# Patient Record
Sex: Female | Born: 1965 | Race: Black or African American | Hispanic: No | Marital: Married
Health system: Southern US, Community
[De-identification: ages and names within clinical notes are randomized; demographics above are authoritative.]

## PROBLEM LIST (undated history)

## (undated) DIAGNOSIS — N200 Calculus of kidney: Secondary | ICD-10-CM

## (undated) DIAGNOSIS — E119 Type 2 diabetes mellitus without complications: Secondary | ICD-10-CM

## (undated) DIAGNOSIS — I10 Essential (primary) hypertension: Secondary | ICD-10-CM

## (undated) DIAGNOSIS — E282 Polycystic ovarian syndrome: Secondary | ICD-10-CM

## (undated) HISTORY — PX: LAPAROSCOPIC, GASTRIC BYPASS, CONVERSION FROM SLEEVE GASTRECTOMY: SHX9539

## (undated) HISTORY — DX: Type 2 diabetes mellitus without complications: E11.9

## (undated) HISTORY — DX: Calculus of kidney: N20.0

## (undated) HISTORY — DX: Polycystic ovarian syndrome: E28.2

## (undated) HISTORY — DX: Essential (primary) hypertension: I10

## (undated) HISTORY — PX: GASTRECTOMY: SHX58

## (undated) HISTORY — PX: BACK SURGERY: SHX140

## (undated) HISTORY — PX: ABDOMINAL HYSTERECTOMY: SHX81

---

## 1989-09-17 HISTORY — PX: REDUCTION MAMMAPLASTY: SUR839

## 2001-12-21 ENCOUNTER — Emergency Department: Admit: 2001-12-21 | Payer: Self-pay | Source: Emergency Department | Admitting: Emergency Medicine

## 2006-04-22 ENCOUNTER — Emergency Department: Admit: 2006-04-22 | Payer: Self-pay | Source: Emergency Department | Admitting: Emergency Medicine

## 2006-04-22 LAB — CBC WITH AUTO DIFFERENTIAL CERNER
Basophils Absolute: 0 /mm3 (ref 0.0–0.2)
Basophils: 1 % (ref 0–2)
Eosinophils Absolute: 0.1 /mm3 (ref 0.0–0.2)
Eosinophils: 2 % (ref 0–5)
Granulocytes Absolute: 2.6 /mm3 (ref 1.8–8.1)
Hematocrit: 42.1 % (ref 37.0–47.0)
Hgb: 13.8 G/DL (ref 12.0–16.0)
Lymphocytes Absolute: 2.8 /mm3 (ref 0.5–4.4)
Lymphocytes: 44 % — ABNORMAL HIGH (ref 15–41)
MCH: 29.7 PG (ref 28.0–32.0)
MCHC: 32.7 G/DL (ref 32.0–36.0)
MCV: 90.8 FL (ref 80.0–100.0)
MPV: 8.4 FL (ref 7.4–10.4)
Monocytes Absolute: 0.8 /mm3 (ref 0.0–1.2)
Monocytes: 13 % — ABNORMAL HIGH (ref 0–11)
Neutrophils %: 41 % — ABNORMAL LOW (ref 52–75)
Platelets: 281 /mm3 (ref 140–400)
RBC: 4.64 /mm3 (ref 4.20–5.40)
RDW: 13 % (ref 11.5–15.0)
WBC: 6.4 /mm3 (ref 3.5–10.8)

## 2006-04-22 LAB — COMPREHENSIVE METABOLIC PANEL - AH CERNER
ALT: 50 U/L — ABNORMAL HIGH (ref 0–31)
AST (SGOT): 41 U/L — ABNORMAL HIGH (ref 0–31)
Albumin/Globulin Ratio: 1.4 (ref 1.1–2.2)
Albumin: 4.3 g/dL (ref 3.4–4.8)
Alkaline Phosphatase: 104 U/L (ref 35–104)
Anion Gap: 11 mEq/L (ref 5–15)
BUN: 8 mg/dL (ref 6–20)
Bilirubin, Total: 0.5 mg/dL (ref 0.0–1.0)
CA: 4.1 mEq/L (ref 3.8–4.6)
CO2: 25.2 mEq/L (ref 22.0–29.0)
Calcium: 9.5 mg/dL (ref 8.4–10.2)
Chloride: 102 mEq/L (ref 96–108)
Creatinine: 0.6 mg/dL (ref 0.4–1.1)
Globulin: 3.1 g/dL (ref 2.0–3.6)
Glucose: 92 mg/dL
Osmolality Calculated: 284 mosm/kg (ref 282–298)
Potassium: 4 mEq/L (ref 3.3–5.1)
Protein, Total: 7.4 g/dL (ref 6.4–8.3)
Sodium: 138 mEq/L (ref 135–145)
UN/CREA SOFT: 13 RATIO (ref 6–33)

## 2006-04-22 LAB — URINALYSIS WITH MICROSCOPIC
Bilirubin, UA: NEGATIVE
Blood, UA: NEGATIVE
Glucose, UA: NEGATIVE
Ketones UA: NEGATIVE
Leukocyte Esterase, UA: NEGATIVE
Nitrite, UA: NEGATIVE
Protein, UR: NEGATIVE
Specific Gravity UA POCT: 1.023 (ref 1.005–1.030)
Squamous Epithelial Cells, Urine: 5 /LPF — ABNORMAL HIGH (ref 0–0)
Urine pH: 5 (ref 4.6–8.0)
Urobilinogen, UA: 0.2 EU/dL (ref 0.2–1.0)

## 2006-04-22 LAB — HEMOLYSIS INDEX: Hemolysis Index: 4 Units

## 2006-04-22 LAB — GFR

## 2006-04-22 LAB — AMYLASE: Amylase: 56 U/L (ref 20–148)

## 2006-04-22 LAB — LIPASE: Lipase: 19 U/L (ref 13–60)

## 2008-01-05 ENCOUNTER — Ambulatory Visit: Admission: RE | Admit: 2008-01-05 | Payer: Self-pay | Source: Ambulatory Visit | Admitting: Internal Medicine

## 2009-03-17 HISTORY — PX: HYSTERECTOMY: SHX81

## 2010-01-16 ENCOUNTER — Encounter (INDEPENDENT_AMBULATORY_CARE_PROVIDER_SITE_OTHER): Payer: Self-pay

## 2010-03-16 ENCOUNTER — Inpatient Hospital Stay
Admission: EM | Admit: 2010-03-16 | Disposition: A | Discharge status: Home / Self Care | Payer: Self-pay | Source: Emergency Department | Admitting: Internal Medicine

## 2010-03-16 LAB — CBC AND DIFFERENTIAL
Baso(Absolute): 0.04 10*3/uL (ref 0.00–0.20)
Basophils: 0 % (ref 0–2)
Eosinophils Absolute: 0.02 10*3/uL (ref 0.00–0.70)
Eosinophils: 0 % (ref 0–5)
Hematocrit: 40.5 % (ref 37.0–47.0)
Hgb: 13.6 g/dL (ref 12.0–16.0)
Immature Granulocytes Absolute: 0.04 10*3/uL
Immature Granulocytes: 0 % (ref 0–1)
Lymphocytes Absolute: 1.77 10*3/uL (ref 0.50–4.40)
Lymphocytes: 15 % (ref 15–41)
MCH: 29.8 pg (ref 28.0–32.0)
MCHC: 33.6 g/dL (ref 32.0–36.0)
MCV: 88.6 fL (ref 80.0–100.0)
MPV: 11.2 fL (ref 9.4–12.3)
Monocytes Absolute: 0.84 10*3/uL (ref 0.00–1.20)
Monocytes: 7 % (ref 0–11)
Neutrophils Absolute: 8.94 10*3/uL
Neutrophils: 77 % — ABNORMAL HIGH (ref 52–75)
Platelets: 258 10*3/uL (ref 140–400)
RBC: 4.57 10*6/uL (ref 4.20–5.40)
RDW: 13 % (ref 12–15)
WBC: 11.61 10*3/uL — ABNORMAL HIGH (ref 3.50–10.80)

## 2010-03-16 LAB — HEMOGLOBIN A1C: Hemoglobin A1C: 7.4 % — ABNORMAL HIGH (ref 0.0–6.0)

## 2010-03-16 LAB — URINALYSIS, REFLEX TO MICROSCOPIC EXAM IF INDICATED
Bilirubin, UA: NEGATIVE
Glucose, UA: 50 — AB
Ketones UA: 25 — AB
Nitrite, UA: POSITIVE — AB
Protein, UR: >300 — AB
RBC, UA: 104 /HPF (ref 0–5)
Specific Gravity UA POCT: 1.005 (ref 1.001–1.035)
Squamous Epithelial Cells, Urine: 1 /HPF (ref 0–25)
Urine pH: 7 (ref 5.0–8.0)
Urobilinogen, UA: NORMAL mg/dL
WBC, UA: 27 /HPF (ref 0–5)

## 2010-03-16 LAB — TSH: TSH: 1.63 u[IU]/mL (ref 0.35–4.94)

## 2010-03-16 LAB — BASIC METABOLIC PANEL
Anion Gap: 12 (ref 5.0–15.0)
BUN: 8 mg/dL (ref 7.0–19.0)
CO2: 22 mEq/L (ref 22–29)
Calcium: 9 mg/dL (ref 8.5–10.5)
Chloride: 103 mEq/L (ref 98–107)
Creatinine: 0.7 mg/dL (ref 0.6–1.0)
Glucose: 225 mg/dL — ABNORMAL HIGH (ref 70–100)
Potassium: 4 mEq/L (ref 3.5–5.1)
Sodium: 137 mEq/L (ref 136–145)

## 2010-03-16 LAB — HEMOLYSIS INDEX: Hemolysis Index: 14 Index (ref 0–18)

## 2010-03-16 LAB — GFR: EGFR: >60

## 2010-03-17 LAB — URINALYSIS, REFLEX TO MICROSCOPIC EXAM IF INDICATED
Bilirubin, UA: NEGATIVE
Glucose, UA: NEGATIVE
Ketones UA: NEGATIVE
Nitrite, UA: NEGATIVE
Protein, UR: 100 — AB
RBC, UA: 116 /HPF (ref 0–5)
Specific Gravity UA POCT: 1.01 (ref 1.001–1.035)
Squamous Epithelial Cells, Urine: 1 /HPF (ref 0–25)
Urine pH: 5 (ref 5.0–8.0)
Urobilinogen, UA: NORMAL mg/dL
WBC, UA: 110 /HPF (ref 0–5)

## 2010-03-17 LAB — HEMOLYSIS INDEX: Hemolysis Index: 2 Index (ref 0–18)

## 2010-03-17 LAB — LIPID PANEL
Cholesterol / HDL Ratio: 3.5 Index
Cholesterol: 211 mg/dL — ABNORMAL HIGH (ref 0–199)
HDL: 60 mg/dL (ref >40)
LDL Calculated: 134 mg/dL — ABNORMAL HIGH (ref 0–99)
Triglycerides: 84 mg/dL (ref 34–149)
VLDL Calculated: 17 mg/dL (ref 10–40)

## 2010-08-25 ENCOUNTER — Ambulatory Visit: Admission: RE | Admit: 2010-08-25 | Payer: Self-pay | Source: Ambulatory Visit | Admitting: Gastroenterology

## 2010-08-25 ENCOUNTER — Ambulatory Visit: Payer: Self-pay

## 2010-08-28 LAB — LAB USE ONLY - HISTORICAL SURGICAL PATHOLOGY

## 2011-06-04 LAB — ECG 12-LEAD
Atrial Rate: 91 {beats}/min
P Axis: 52 degrees
P-R Interval: 174 ms
Q-T Interval: 360 ms
QRS Duration: 76 ms
QTC Calculation (Bezet): 442 ms
R Axis: 0 degrees
T Axis: 30 degrees
Ventricular Rate: 91 {beats}/min

## 2011-07-06 NOTE — Consults (Signed)
PATIENTJACORA, [name]      MEDICAL RECORD NUMBER:         [id number]      PATIENT LOCATION/SERVICE:       [facility]            DATE OF CONSULTATION:          01/05/2008      CONSULTING PHYSICIAN:          Tally Due, MD      REFERRING PHYSICIAN:            REASON FOR CONSULTATION:  New patient referral for lumbar epidural steroid      injection.            HISTORY OF PRESENT ILLNESS:  The patient is a very pleasant 45 year old      woman who has been suffering from back problems since February of this      year.  It started after she shoveled snow.  After shoveling snow, she says      her back got very stiff.  Initially, she said it felt like the stiffness      was getting better but actually, the pain kept on increasing and since the      first week of March, it has gotten much worse.            Her pain is located across her low back and radiates to the lateral aspect of      her right hip and radiates further down along the posterior thigh, up to her      knee.  She denied any radiation distal to the knee.  She denied any pain in      her left side.            She reported associated tingling and numbness in her right leg and      toes.            She has taken anti-inflammatory medications for this.  Aleve did not      help much.  She took a course of oral steroids.  Oral steroids helped her      just a little bit with stiffness but did not help much with the pain.  She      has taken Vicodin.  She has also gone through physical therapy but it has      not helped much.            Severity of her pain is about 5 out of 10 right now.  The      worst pain lately could be about 7 out of 10.  Two weeks ago, it was even      worse at 9 to 10 out of 10.            The pain is alleviated to some extent by sitting.  It is worse by standing      and walking.  She denied any weakness in      her lower extremities.  She denies buckling of her knees.  She reported      occasionally that she needs  to drag her feet.            She denied urinary hesitancy      or incontinence.  A few days ago, she noted urinary  frequency and thought      that she was having a urinary tract infection.  She said today she was      feeling fine.  Pain affects her sleep and physical activity and she is      looking for some help.            MRI of the lumbosacral spine performed on      12/15/2007 has been reported as follows      1.   At T12-L1 and L1-L2, the intervertebral disks showed minimal      degeneration.      2.   L2-L3 level is unremarkable.      3.   At L3-L4, there is diffuse central, very minimal chronic disk bulge      with related spinal narrowing on contralateral basis.  There is evidence of      degenerative disk disease and degeneration of right facet joint.  Foramen      are patent.      4.   At L4-L5, there is diffuse central mild to moderate disk bulge with      small tear of the posterior annulus centrally without extrusion and mild to      moderate spinal stenosis.  The intervertebral disk showed mild      degeneration.  There is very minimal narrowing of the left foramen.            PAST MEDICAL HISTORY:  Past medical history is significant for      1.   Hypertension.      2.   Migraine headaches.      3.   Polycystic ovary disease.            PAST SURGICAL HISTORY:  Past surgical history is significant for breast      reduction.            REVIEW OF SYSTEMS:  Review of systems is as mentioned in the history of      present illness.  Her last menstrual period was 1-1/2 years ago.  This is      because of polycystic ovarian disorder.            MEDICATIONS:   Current medications are      1.   Metoprolol 50 mg p.o. daily.      2.   Aleve p.r.n.      3.   Vicodin p.r.n.      4.   Flexeril p.r.n.            She has not taken Aleve in the past two weeks.            PHYSICAL EXAMINATION:  On physical examination, the patient was awake,      alert and oriented x3.  There was no acute distress.  She was 5 feet,  7.5      inches tall and weighed 291 pounds.  Blood pressure was 160/103, heart rate      was 92 beats per minute, respirations were 20 per minute, temperature was      98.2 and oxygen saturation was 98% on room air.  Heart:  Regular.  Lungs:      Showed no acute respiratory distress.  She was obese.  Lower extremity      examination showed sensation to light touch and was intact along the left      lower extremity.  Sensation to light touch was reduced along  the medial      foot, dorsum, lateral and bottom of her right foot.  The strength was 5/5      bilaterally.  There was no clonus bilaterally.  There was midline and      paramedian tenderness along the lumbosacral spine bilaterally.  L4 patellar      and S1 ankle reflexes were absent bilaterally.  Plantar reflexes were      downgoing.            ASSESSMENT AND PLAN      1.   Chronic low back pain with right lower extremity radiation.      2.   Evidence of degenerative disk disease, disk bulge with tear of      posterior annulus.  Evidence of mild to moderate spinal stenosis.      3.   Evidence of lumbosacral radiculopathy.            It is appropriate to try lumbar epidural steroid injection.  I would choose      to do it at L4-L5 today.  The next option would be to do transforaminal      epidural steroid injection.  We discussed the following aspects with      respect to the procedure.  They included      1.   Relationship between magnetic resonance imaging findings and her      symptoms.      2.   Rationale for the procedure.      3.   Outcome data.      4.   Onset and duration of action.      5.   Description of the procedure.      6.   Expectations and plan of action after the first injection.      7.   Risks, benefits and alternative treatments.  The risks associated with      the procedure included but are not limited to bleeding, infection, may not      work, pain may get worse, nerve injury, paralysis, allergic reactions,      spinal headache and side  effects related to steroids.            Side effects related to steroids included but are not limited to increase      in blood pressure, increase in blood sugar, fluid retention, lowering of      immune response, possibility of weight gain and adverse bad effects on      bones.            I answered all questions to her and her husband's satisfaction.  She was      agreeable to go ahead with the procedure.            Thank you for the referral.                                    Electronic Signing MD: Tally Due, MD  ([id number])            UEA:[id number]      D:    01/07/2008      T:    01/08/2008      #:    [id number]      N:    [id number]            cc:   Abran Cantor, MD  Tally Due, MD

## 2011-07-06 NOTE — Consults (Signed)
JOURNIE, HOWSON      MRN:          [id number]      Account:      [id number]      Document ID:  [id number]      Service Date: 03/17/2010            Admit Date: 03/16/2010            Patient Location: A2312-B      Patient Type: I            CONSULTING PHYSICIAN: Jeremy Johann MD            REFERRING PHYSICIAN:                  REASON FOR CONSULTATION:      Urinary tract infection with obstruction at left ureterovesical junction.            HISTORY OF PRESENT ILLNESS:      This 45 year old female had the onset of some pain with urination and      frequency a day prior to presenting herself to the emergency room, at which      time she had developed some severe lower abdominal pain on the left side      associated with nausea and vomiting.  At that the time she had no chills or      fever.            In the emergency room, the urinalysis was positive for nitrite and pyuria.      A CT scan demonstrated left hydronephrosis with obstruction of the ureter      down to the ureterovesical junction.  The dilatation of the ureter was more      so on the distal portion than near the kidney.  There was no obvious cause      noted for the dilatation of the ureter and specifically there was no stone      seen.            In the emergency room, the patient received 1 gram of Rocephin and 750 mg      oral Levaquin.  Urine was obtained for culture and she was admitted to the      floor.            She has no past history of having kidney infections.  There is no previous      episode of left flank pain.  She has never been evaluated by an urologist      before and there is no childhood history of chills, fevers, or known      urinary tract infections.            Today, less than 24 hours after admission, she feels improved, although she      is still having enough left-sided pain, which is located mainly in the      lower quadrant, to have IV Dilaudid given.            PAST MEDICAL HISTORY:      Hypertension.            PAST  SURGICAL HISTORY:      Partial hysterectomy.                                   Page 1 of 3      Berke,  Liddie      MRN:          [id number]      Account:      [id number]      Document ID:  [id number]      Service Date: 03/17/2010                  SOCIAL HISTORY:      Does not smoke.  Alcohol socially.            MEDICATIONS:      Nexium 20 mg p.r.n. and Toprol-XL 50 mg daily.            ALLERGIES:      None.            REVIEW OF SYSTEMS:      CONSTITUTIONAL:  No chills or fever.      CARDIOVASCULAR:  No chest pain or palpitations.      RESPIRATORY:  No shortness of breath.      GASTROINTESTINAL:  See history of present illness.            PHYSICAL EXAMINATION:      GENERAL:  Female, at present in no distress, although has received some IV      Dilaudid this afternoon.      VITAL SIGNS:  Temperature 97.4, pulse 99, blood pressure 151/77.      HEENT:  Clear.      CHEST:  Clear to auscultation.      HEART:  Regular rhythm without murmur.      ABDOMEN:  Soft, mildly obese, some tenderness in the left lower quadrant,      mild CVA tenderness on the left.      EXTREMITIES:  Within normal limits.            LABORATORY DATA:      White count 11,610; hematocrit 40.5; 77 neutrophils.  BUN 8, creatinine      0.7, calcium 9.0.  Urinalysis:  Pink in color, large leukocyte esterase,      positive for nitrites, greater than 300 protein, 50 urine glucose, 25 urine      ketones, large blood, 27 white cells, and 104 red cells per high-power      field.            DIAGNOSTIC STUDIES:      CT report:  Mild left hydronephrosis and dilatation of the left ureter to      the level of the ureterovesical junction.  No calculi identified.            IMPRESSION:      1.  Left pyelonephritis.      2.  Left hydroureteronephrosis with obstruction at ureterovesical junction,      etiology unclear.            DISPOSITION:      With the urine culture pending I would recommend that the Rocephin be                                   Page 2 of 3       MASAE, LUKACS      MRN:          [id number]      Account:      [id number]      Document ID:  [id number]      Service Date: 03/17/2010  continued.  The obstruction at the left ureterovesical junction will be      evaluated.  Should she continue to have left flank pain as the      pyelonephritis is treated she will need a left retrograde pyelogram and a      possible stent placement.  If, however, her symptoms clear with antibiotic      coverage this could be evaluated as an outpatient after her symptoms      resolve.  This was discussed with the patient and her husband.  The      decision as to whether to do this during this hospitalization or later will      be determined by her course over the next few days.                        Electronic Signing Provider            D:  03/17/2010 13:17 PM by Dr. Jule Ser. Maurice Small, MD [phone])      T:  03/17/2010 17:27 PM by [email or web address]                  cc:                                   Page 3 of 3      Authenticated by Jeremy Johann, MD On 03/28/2010 04:25:13 PM

## 2011-07-06 NOTE — Op Note (Signed)
[name], [name]      MEDICAL RECORD NUMBER:         [id number]      PATIENT LOCATION/SERVICE:       [facility]            DATE OF SURGERY:               01/05/2008      SURGEON:                       Tally Due, MD      ASSISTANT 1:            PREOPERATIVE DIAGNOSES      1.   Chronic low back pain with right lower extremity radiation, evidence      of degenerative joint disease with disk bulge and posterior annular tear.      No evidence of disk extrusion.      2.   Mild to moderate spinal stenosis.      3.   Right-sided lumbosacral radiculopathy.            POSTOPERATIVE DIAGNOSES      1.   Chronic low back pain with right lower extremity radiation, evidence      of degenerative joint disease with disk bulge and posterior annular tear.      No evidence of disk extrusion.      2.   Mild to moderate spinal stenosis.      3.   Right-sided lumbosacral radiculopathy.            PROCEDURE:  Lumbar epidural steroid injection between L4 and L5 following      an epidurogram.            ANESTHESIA:  Local.            DESCRIPTION OF PROCEDURE:  Performed history and physical examination in      the endoscopy holding area.  Discussed risks, benefits and alternative      treatments in detail.  Answered all questions to her satisfaction and      obtained a consent.            We took her to the procedure room and placed her in the prone position.      Kept pillows under her abdomen to eliminate lumbar lordosis.  Placed      noninvasive monitors.  Prepped her back with Betadine x3 and draped it in a      sterile fashion.  Visualized the lumbosacral spine in an AP view.  Located      L4-L5 interspace.  Centralized the spinous processes.  Obtained 10-degree      caudad tilt to open up the intervertebral space.  Raised the skin wheal and      infiltrated subcutaneous tissues in the area overlying the L4-L5      interspace.  Performed the procedure using 15 cm epidural needle, 17 gauge.       Introduced and advanced the needle under fluoroscopic control.  When the      needle engaged into ligaments, initiated loss-of-resistance technique.      Obtained loss of resistance to preservative-free normal saline at 10 cm.      There was no cerebral spinal fluid, heme, or paresthesias.  Visualized the  needle position in the lateral view and found it to be appropriate.      Injected contrast dye in increments.  Noted appropriate contrast spread in      the lateral as well as in AP view.  Following this, injected medications      after negative aspiration.  Flushed the needle with 1 mL of      preservative-free normal saline and withdrew the needle.            MEDICATIONS USED      1.   Carbonated lidocaine, 2 mL.      2.   Contrast agent was Omnipaque 240, 1.5 mL.      3.   Medications used for injection were Depo-Medrol 80 mg plus 5 mL of      preservative-free normal saline.            The patient was alert, awake and oriented throughout the procedure.            FLUOROSCOPY NARRATIVE:  Obtained several AP and lateral images throughout      the procedure.  Overall, this was necessary for safety and accuracy of the      procedure.            Post-procedure, transferred her to the recovery area on the stretcher.      There were no complaints or complications.  Discharged her home with      appropriate discharge instructions.                                    Electronic Signing MD: Tally Due, MD  ([id number])            UEA:[id number]      D:    01/05/2008      T:    01/06/2008      #:    [id number]      N:    [id number]            cc:   Abran Cantor, MD            Tally Due, MD

## 2011-07-06 NOTE — Op Note (Signed)
[name], [name]      MRN:          [id number]      Account:      [id number]      Document ID:  [id number]      Procedure Date: 08/25/2010            Admit Date: 08/25/2010            Patient Location: DISCHARGED 08/25/2010      Patient Type: A            SURGEON: Bland Span MD      ASSISTANT:                  PREOPERATIVE DIAGNOSIS:      Dyspepsia.            POSTOPERATIVE DIAGNOSES:      Gastric polyps, mild gastropathy.            TITLE OF PROCEDURE:      Upper endoscopy with biopsy.            REFERRING PHYSICIAN:      Dr. Betti Cruz.            INTRAOPERATIVE MEDICATIONS      Per anesthesia.            INDICATIONS:      This upper endoscopy is performed in this patient who has a history in the      past of dyspepsia.  The patient has been on Nexium.  The procedure, the      indications, benefits, and risks of the procedure including but not limited      to infection, bleeding, adverse reaction to medication, and perforation had      been discussed with the patient who had an opportunity for questions.            DESCRIPTION OF PROCEDURE:      The endoscope introduced under direct visualization, advanced to the second      portion of the duodenum.  The esophagus itself was grossly normal in      appearance.  In light of her history, biopsies of the distal esophagus      obtained.  There was mild diffuse gastropathy noted.  There were several      gastric polyps identified and these were biopsied extensively.  Duodenal      bulb, second portion normal.  The patient tolerated procedure well.  No      immediate complications.            IMPRESSION:      1.  Mild gastropathy.      2.  Gastric polyps, likely benign.            RECOMMENDATIONS:                                   Page 1 of 2      [name], [name]      MRN:          [id number]      Account:      [id number]      Document ID:  [id number]      Procedure Date: 08/25/2010            1.  Await biopsy results.      2.  Continue proton pump inhibitors.      3.  Proceed with colonoscopy.                        Electronic Signing Provider            D:  08/25/2010 14:17 PM by Dr. Bland Span, MD [phone])      T:  08/25/2010 15:54 PM by [id number]                  cc:Dr. Betti Cruz                                   Page 2 of 2      Authenticated by Bland Span, MD [phone]) On 09/08/2010 01:35:28 PM

## 2011-07-06 NOTE — H&P (Signed)
[name], [name]      MRN:          [id number]      Account:      [id number]      Document ID:  [id number]                  Admit Date: 03/16/2010            Patient Location: A2312-B      Patient Type: I            ATTENDING PHYSICIAN: Pershing Cox, MD                  ADMITTING DIAGNOSES:      1.  Flank pain _____.      2.  Urinary tract infection.      3.  Hyperglycemia.      4.  History of polycystic ovarian syndrome.            HISTORY OF PRESENT ILLNESS:      The patient is being admitted through the emergency room with symptoms of      nausea, flank pain, with mild hematuria, duration 24 to 48 hours.  Patient      also complained of frequency, urgency.  Denies any fever, denies any      diarrhea, hematemesis, or melena.  The patient denies any chest pain,      orthopnea, PND.            PAST MEDICAL HISTORY:      As above.            SOCIAL HISTORY:      Negative for current smoking, no alcohol.            CURRENT MEDICATIONS:      None.            REVIEW OF SYSTEMS:      Positive flank pain; at present, no nausea, vomiting, no orthopnea, or PND.       No fever, chills.  Positive frequency, urgency.            PHYSICAL EXAMINATION:      GENERAL:  The patient is a middle-aged, obese female, not in any acute      distress.      SKIN:  Normal.      HEENT:  Sclerae nonicteric.  Oral mucosa normal.  Thyroid normal.      CARDIOVASCULAR:  S1, S2, no murmur.      LUNGS:  Clear, no rales or wheeze.      ABDOMEN:  Bowel sounds positive.  Right flank discomfort positive.      EXTREMITIES:  Edema negative.  Pulses equal and bilateral.      VITAL SIGNS:  Temperature of 98, pulse of 72, respiratory rate of 18, blood      pressure 130/70.                                   Page 1 of 2      Tamara, [name]      MRN:          [id number]      Account:      [id number]      Document ID:  [id number]                        LABORATORY  DATA:      WBC 11, hemoglobin 13, hematocrit 40, platelet of 258.  Sodium 137,      potassium 4.0, BUN  of 8, creatinine 0.7.  UA _____ blood positive,      leukocyte positive.            ASSESSMENT AND PLAN:      1.  Flank pain, _____ urinary tract infection.  Would admit the patient on      a medical floor, started on intravenous fluids, panculture, started on      Rocephin, urology consultation.      2.  Increased blood sugar, would order hemoglobin A1c, patient put on deep      venous thrombosis and ulcer prophylaxis.            Case discussed with the family member.                        Electronic Signing Provider            D:  03/16/2010 18:01 PM by Dr. Pershing Cox, MD ([id number])      T:  03/16/2010 18:27 PM by [id number]                  [id number] Dava Najjar MD                                   Page 2 of 2      Authenticated by Pershing Cox, MD On 03/18/2010 11:18:40 AM

## 2011-07-06 NOTE — Op Note (Signed)
AMELA, HANDLEY      MRN:          [id number]      Account:      [id number]      Document ID:  [id number]      Procedure Date: 08/25/2010            Admit Date: 08/25/2010            Patient Location: [facility]ENS-31      Patient Type: A            SURGEON: Bland Span MD      ASSISTANT:                  PREOPERATIVE DIAGNOSES:      Constipation, colorectal screening.            POSTOPERATIVE DIAGNOSES:      1.  Ascending colonic polyp removed by hot snare polypectomy technique.      2.  Diverticulosis, mild, left-sided.      3.  Nonbleeding hemorrhoids.            TITLE OF PROCEDURE:      Colonoscopy with polypectomy.            REFERRING PHYSICIAN:      Hilbert Odor. Ernestine Mcmurray, MD.                  INDICATIONS:      This colonoscopy is performed in this 45 year old female who has had      constipation.  She also presents for colorectal screening and surveillance      in light of this symptom.  The indications, benefits, and risks of the      procedure including but not limited to infection, bleeding, adverse      reaction to medication, perforation have been discussed with the patient      who had an opportunity for questions.            DESCRIPTION OF PROCEDURE:      Rectal examination performed, grossly normal.  Colonoscope introduced under      direct visualization, advanced to the cecum.  Quality of the preparation      adequate.  Cecum identified by the ileocecal valve, cecal folds.  Scope      withdrawn, mucosa inspected.  There was an approximately 1-cm polyp of the      ascending colon, which was removed by hot snare polypectomy technique,      retrieved, and sent for histologic evaluation.  Mild left-sided      diverticulosis.  Nonbleeding hemorrhoids on turnaround examination.  The      patient tolerated the procedure well.  No immediate complications.            IMPRESSION:      1.  Left-sided diverticulosis, mild.      2.  Ascending colonic polyp removed by hot snare polypectomy technique.                                    Page 1 of 2      AUDRY, KAUZLARICH      MRN:          [id number]      Account:      [id number]      Document ID:  [id number]      Procedure Date: 08/25/2010  3.  Nonbleeding hemorrhoids.            RECOMMENDATIONS:      1.  Await histology report.      2.  Encourage high-fiber diet.                        Electronic Signing Provider            D:  08/25/2010 14:39 PM by Dr. Bland Span, MD [phone])      T:  08/25/2010 15:03 PM by [id number]                  [id number] Corella MD                                   Page 2 of 2      Authenticated by Bland Span, MD [phone]) On 09/08/2010 01:35:23 PM

## 2011-07-06 NOTE — Discharge Summary (Signed)
[name], [name]      MRN:          [id number]      Account:      [id number]      Document ID:  [id number]                  Admit Date: 03/16/2010      Discharge Date: 03/18/2010            ATTENDING PHYSICIAN:  Pershing Cox, MD                  ADMITTING DIAGNOSIS:      1.  Flank pain.      2.  Hypoglycemia, increasing hemoglobin A1c.      3.  History of polycystic ovarian syndrome.      4.  Urinary tract infection.      5.  Mild _____.            HOSPITAL COURSE:      Over the course of hospital stay, the patient was admitted, was started on      IV antibiotics, pain management.  A urology consultation with Dr. Maurice Small was      taken in.  The patient had a UA culture done, which showed an E. coli.      Over the course of hospital stay, the patient's clinical symptoms were      improved.  The patient noted to have an increased hemoglobin A1c for which      patient is advised to contact primary care physician for DM evaluation.      The patient would be discharged today on Levaquin and pain medications,      advised to follow Dr. Maurice Small.  If the patient comes back, the patient      probably need a cystoscopy and further intervention by urology.            DISPOSITION:      Follow with Dr. Modesto Charon, otherwise Dr. Maurice Small, urology.  Follow with primary      care physician for elevated hemoglobin A1c.  The patient's hemoglobin A1c      was 7.4.  UA showed E. coli.                        Electronic Signing Provider            D:  03/18/2010 13:08 PM by Dr. Pershing Cox, MD ([id number])      T:  03/19/2010 12:35 PM by [id number]                        WN:[id number] Dava Najjar MD                                   Page 1 of 1      Authenticated by Pershing Cox, MD On 03/19/2010 02:02:37 PM

## 2012-02-14 NOTE — Progress Notes (Signed)
Needs previsit call. Phone number disconnected.

## 2012-03-03 ENCOUNTER — Ambulatory Visit: Payer: BLUE CROSS/BLUE SHIELD | Attending: Internal Medicine

## 2012-03-03 VITALS — BP 138/90 | Ht 67.0 in | Wt 267.4 lb

## 2012-03-03 DIAGNOSIS — E119 Type 2 diabetes mellitus without complications: Secondary | ICD-10-CM | POA: Insufficient documentation

## 2012-03-03 NOTE — Progress Notes (Signed)
Nutrition Assessment    Eating History:  Meal times:  Breakfast 9:30   Lunch 2:30   Supper 8 pm  Do you eat snacks? Not noted on assessment  When? n/a      Weight Category: Obesity Grade 3    Last Vitals: BP 138/90  Ht 1.702 m (5\' 7" )  Wt 121.292 kg (267 lb 6.4 oz)  BMI 41.88 kg/m2  Pre Pregnant Weight n/a  Recommended Weight goal 118-159 lb  Est calorie needs  1300 for significant weight loss  Activity factor: Sedentary  Total carbs recommended  165 grams  Number of carb choices recommended 11    Comments:

## 2012-03-10 ENCOUNTER — Ambulatory Visit: Payer: BLUE CROSS/BLUE SHIELD

## 2012-03-25 ENCOUNTER — Encounter (INDEPENDENT_AMBULATORY_CARE_PROVIDER_SITE_OTHER): Payer: Self-pay

## 2012-05-23 ENCOUNTER — Ambulatory Visit (INDEPENDENT_AMBULATORY_CARE_PROVIDER_SITE_OTHER): Payer: BLUE CROSS/BLUE SHIELD | Admitting: Surgical Oncology

## 2012-06-06 ENCOUNTER — Ambulatory Visit (INDEPENDENT_AMBULATORY_CARE_PROVIDER_SITE_OTHER): Payer: BLUE CROSS/BLUE SHIELD | Admitting: Surgical Oncology

## 2012-06-23 ENCOUNTER — Ambulatory Visit (INDEPENDENT_AMBULATORY_CARE_PROVIDER_SITE_OTHER): Payer: BLUE CROSS/BLUE SHIELD | Admitting: Surgical Oncology

## 2012-06-23 ENCOUNTER — Encounter (INDEPENDENT_AMBULATORY_CARE_PROVIDER_SITE_OTHER): Payer: Self-pay | Admitting: Surgical Oncology

## 2012-06-23 VITALS — BP 142/71 | HR 86 | Temp 97.4°F | Ht 66.0 in | Wt 272.0 lb

## 2012-06-23 DIAGNOSIS — N644 Mastodynia: Secondary | ICD-10-CM

## 2012-06-23 DIAGNOSIS — N6019 Diffuse cystic mastopathy of unspecified breast: Secondary | ICD-10-CM

## 2012-06-23 NOTE — Progress Notes (Signed)
Subjective:       Patient ID: [name] is a 46 y.o. female.    HPI    [name] is a 46 y.o. female who presents today for evaluation of mastalgia.   She reports the presence of right breast pain for the past three to four months.   The pain occurs with palpation only.  When it occurs it is sharp but not stabbing.   She reports 2 cups of coffee per week and cola/chocolate a few times a week.  She had a hysterectomy in 2010 for questionable findings on an endometrial biopsy.  She denies a history of cyst aspirations, breast biopsies or mastitis.   She had a bilateral breast reduction in 1991.   She denies any palpable findings referable to her breast, difficulties with nipple discharge or breast skin changes.  Her last mammogram was performed on 03/25/12 at [facility] and was negative. A right breast ultrasound of the 8-10 o'clock radian was performed and was negative.  No solid or cystic masses seen.  She has brought with reports but not the films for review.    There is no family history of breast cancer.         Review of Systems  ROS:  Constitutional:  night sweats  HEENT:  vision problems - glasses/contacts, sinus pain, dental problems  Cardiovascular:  pain in legs at rest  Respiratory:  negative  Gastrointestinal:  negative  Genitourinary:  urinary urgency  Musculoskeletal:  negative  Skin:  negative  Neurologic:  headache, tingling  Psychiatric:  negative  Endocrine:  high blood sugar  Hematologic:  negative                             Objective:    Physical Exam    WDWN female in NAD  HEENT:  Clear, no scleral icterus, neck supple  Neck:  No thyromegaly or masses, trachea midline  Chest:  Clear to auscultation, respiratory effort normal  CV:  RRR without murmurs or gallops, no pedal edema  Abd:  No tenderness, organomegaly or masses  BJM:  Gait and station normal  Ext:  No CCE  Skin:  Free of significant ulcers or lesions  Neuro:  Grossly intact, no focal findings, alert and oriented, asks  appropriate questions  LN:  No axillary, supraclavicular or cervical adenopathy  Breast: Examined in the upright and supine positions.  Both nipples are everted.  The breast tissue is noted to be mildly dense bilaterally in the upper outer quadrants.  Well healed surgical incisions from her prior reduction procedure are noted bilaterally.  Tender mobile compressible nodularity is noted in the  right breast 2 o'clock radian,  5 cm from the nipple.  Tender mobile compressible nodularity is noted in the left breast 1-2 o'clock radian,  12 cm from nipple        Assessment:       Mastalgia       Plan:         Ms. Hunger was advised there are no worrisome finding on her clinical exam today.   There is an area of focal tenderness in the right 2 o'clock and left breast 1-2 o'clock.  Clinically these areas are consistent with fibrocystic tissue.  She will proceed with an ultrasound of the right UIQ and left UOQ to further evaluate the focal tenderness.   Assuming the ultrasound is unremarkable, she will return in three  months for a follow up exam.      With regards to her mastalgia triggers such as increased caffeine or improper fitting bra were reviewed.  However she was advised the most common cause of breast discomfort is hormonal fluctuation and stimulation to the breast tissue.  Consideration for the use of Oil of  evening primrose 500 mg twice daily was reviewed.   She was provided with written information regarding the management of mastalgia.  She will attempt to decrease her cola intake as well.       Breast cancer risk factors including onset of menarche before age 73, first full-term pregnancy after the age of 2, nulliparous status, family history of breast malignancy particularly in first degree relatives or a personal history of prior breast biopsies especially those demonstrating any atypia were reviewed.   At present her breast cancer risk factors include her nulliparous status and menarche at age 80.  We  reviewed her PCOS and use of metformin likely reduce her risk of breast cancer.

## 2012-06-23 NOTE — Patient Instructions (Signed)
Breast Pain    Breast pain, also known as mastalgia or mastodynia, is the most common breast symptom for which women seek medical consultation. It is however, rarely a sign of breast cancer.     Characteristics    Breast pain may be described as dull, aching, sore, heavy, sharp or stabbing. It sometimes radiates into the arm of the axilla (underarm). Sometimes, instead of pain, there are symptoms of burning, discomfort, or itching in the breast. It is most common for the pain to be in the upper outer quadrant of the breast, although it can be present in other locations. It can occur in one or both breasts.     Timing    The most common type of breast pain is related to our hormones during a monthly cycle. Stimulated by estrogen and progesterone, the breasts swell and become more lumpy and tender. This type of cyclic breast pain occurs in both breasts, sometimes more one-sided than another. It may differ from month to month but always gets worse before a period and then lets up with menstruation.   Breast pain can be non cyclical (unrelated to the menstrual cycle) or occur in the post-menopausal setting.  It is common for breast pain to worsen during the five to ten years prior to menopause. Symptoms typically improve after menopause unless a woman is overweight or uses postmenopausal hormone replacement therapy.     Causes    The cause of breast pain is not well understood. It is commonly a sign of hormonal imbalance, especially cyclical breast pain, or may be caused by metabolic damage due to stress, poor nutrition, or other factors.     Not all breast pain has a hormonal basis. Non-cyclical breast pain may be related to underlying breast cysts, fibroadenomas, mastitis, abscesses, scarring or injury. These benign conditions require evaluation and treatment of the underlying problem which may relieve the pain.     Medications may cause breast pain, tenderness and swelling. These include some hormonal replacement  therapy (HRT) and birth control pills. Some psychiatric medications or antidepressants may increase breast pain. Even some cholesterol-lowering and heart medications can cause breast changes. Do not stop taking prescription medication without consulting your health care provider.     Many herbal products such as Ginseng, Dong Quai, Black Cohosh, Ma Huang, Guarana and Soy contain plant estrogens, and may also cause women to experience breast pain.     Caffeine (coffee, cola, tea and chocolate) and salt can exacerbate breast pain especially for women with fibrocystic breasts.     Musculoskeletal chest pain may be confused with breast pain, but does not actually originate in the breast tissue. It is often described as an aching or burning pain caused by inflammation or injury of the muscles, cartilage, or bones of the chest.     Inflammation of the rib cartilage (costochondritis) or a pinched nerve in the back can mimic breast pain. Intense exercise or weight lifting can also exacerbate breast pain.     Evaluation      A careful history of the characteristics of the breast pain and other breast symptoms is important. A clinical breast examination, mammogram and possibly a breast ultrasound can be useful in ruling out underlying breast abnormalities. If the evaluation is normal, the breast pain is likely not a suspicious symptom.     Management    Management of breast pain should be individualized. The nature, cause and severity may be different for each person.       Surgery has no role in the treatment of breast pain when there are no abnormalities on physical exam and mammogram. Typically, it is unusual for breast pain to be severe enough to require medication.     The following measures have been described in the medical literature, but results have varied. Some studies show that these interventions reduce pain, while others do not. The measures may be effective for some women. These interventions can take several  months to relieve breast discomfort.     Dietary changes    It is best to try one intervention at a time (for at least one month). This will allow you to determine what is effective for you as an individual.     Caffeine Avoidance: Caffeine is found in coffee, tea, soft drinks, chocolate, and many over-the-counter medications (i.e. Excedrin). Data demonstrates that the best results were obtained in women who completely eliminated caffeine; however, even cutting down seemed to helped. (Caffeine is not associated with the development of breast lumps or breast cancer.)    Fat Reduction: Reduce your intake of high-fat foods. Your goal should be for fat calories to consist of 25% or less of your total calories per day. If you have received dietary fat restrictions for other health reasons, please follow those recommendations.     Salt Reduction: High sodium diets cause your body to retain more fluid. Reducing salt may reduce fluid retention and breast pain.     Dietary Supplements:*    - Evening Primrose Oil: A naturally occurring fatty acid (gamma linolenic acid) in oral capsules (500 mg). Dose: 1500-3000mg daily. Take in divided doses twice daily.   - Vitamin E supplements (400 IU or less daily)  -  Vitamin B Complex    * These are not pain medications, so the results are not immediate. If there is no improvement in the pain after two month, discontinue use of the product, as it is unlikely to help.    Mechanical Interventions    Change in undergarments: A support bra minimizes movement of breast and prevents stretching of nerve fibers. For some women, wearing a bra at night may be helpful. You may have to try different styles of bra or consult a certified bra fitter to discover which one is most comfortable for you.     Hot / Cold Therapy: Applying a heated pad or ice pack may reduce swelling, therefore reducing pain.    Breast Massage: May reduce pain be helping remove excess fluid through the lymphatic system.      Keeping Track of Your Pain    To try and determine the cause of your pain and pattern of change, it is helpful to keep a calendar of the days you experience pain and intensity of the pain. The following information is important to include: day you menstrual period begins and ends, severity (rating on a scale of 1-10), location in the breast where pain occurs, time of day pain is experienced, and any unusual physical activities, dietary changes, or medical usage

## 2012-07-28 ENCOUNTER — Encounter (INDEPENDENT_AMBULATORY_CARE_PROVIDER_SITE_OTHER): Payer: Self-pay

## 2012-10-27 ENCOUNTER — Telehealth (INDEPENDENT_AMBULATORY_CARE_PROVIDER_SITE_OTHER): Payer: Self-pay | Admitting: Surgery

## 2012-10-28 NOTE — Telephone Encounter (Signed)
10/27/12 - Called patient to remind her of appt with Dr .Smitty Cords (due Jan. 2014). Patient states she will call back to schedule appt later -TP

## 2014-02-19 ENCOUNTER — Other Ambulatory Visit: Payer: Self-pay | Admitting: Physical Medicine & Rehabilitation

## 2014-02-19 DIAGNOSIS — D1739 Benign lipomatous neoplasm of skin and subcutaneous tissue of other sites: Secondary | ICD-10-CM

## 2014-02-19 DIAGNOSIS — M48061 Spinal stenosis, lumbar region without neurogenic claudication: Secondary | ICD-10-CM

## 2014-02-23 ENCOUNTER — Ambulatory Visit: Payer: BLUE CROSS/BLUE SHIELD | Attending: Physical Medicine & Rehabilitation

## 2014-02-23 DIAGNOSIS — D1739 Benign lipomatous neoplasm of skin and subcutaneous tissue of other sites: Secondary | ICD-10-CM

## 2014-02-23 DIAGNOSIS — M5126 Other intervertebral disc displacement, lumbar region: Secondary | ICD-10-CM | POA: Insufficient documentation

## 2014-02-23 DIAGNOSIS — M48061 Spinal stenosis, lumbar region without neurogenic claudication: Secondary | ICD-10-CM

## 2014-03-11 ENCOUNTER — Ambulatory Visit
Admission: RE | Admit: 2014-03-11 | Discharge: 2014-03-11 | Disposition: A | Discharge status: Home / Self Care | Payer: BLUE CROSS/BLUE SHIELD | Source: Ambulatory Visit | Attending: Anesthesiology | Admitting: Anesthesiology

## 2014-03-11 ENCOUNTER — Encounter: Payer: Self-pay | Admitting: Anesthesiology

## 2014-03-11 ENCOUNTER — Ambulatory Visit: Payer: BLUE CROSS/BLUE SHIELD | Admitting: Anesthesiology

## 2014-03-11 ENCOUNTER — Encounter
Admission: RE | Disposition: A | Discharge status: Home / Self Care | Payer: Self-pay | Source: Ambulatory Visit | Attending: Anesthesiology

## 2014-03-11 HISTORY — PX: AX PAIN CLINIC REQUEST: SHX3219

## 2014-03-11 LAB — GLUCOSE WHOLE BLOOD - POCT: Whole Blood Glucose POCT: 352 mg/dL — ABNORMAL HIGH (ref 70–100)

## 2014-03-11 SURGERY — AX PAIN CLINIC REQUEST
Anesthesia: Local

## 2014-03-11 MED ORDER — DEXAMETHASONE SODIUM PHOSPHATE 10 MG/ML IJ SOLN
INTRAMUSCULAR | Status: AC
Start: 2014-03-11 — End: ?
  Filled 2014-03-11: qty 1

## 2014-03-11 MED ORDER — METHYLPREDNISOLONE ACETATE 40 MG/ML IJ SUSP
INTRAMUSCULAR | Status: AC
Start: 2014-03-11 — End: ?
  Filled 2014-03-11: qty 2

## 2014-03-11 MED ORDER — LIDOCAINE HCL 1 % IJ SOLN
INTRAMUSCULAR | Status: AC
Start: 2014-03-11 — End: ?
  Filled 2014-03-11: qty 20

## 2014-03-11 MED ORDER — BUPIVACAINE HCL (PF) 0.25 % IJ SOLN
INTRAMUSCULAR | Status: AC
Start: 2014-03-11 — End: ?
  Filled 2014-03-11: qty 30

## 2014-03-11 NOTE — Anesthesia Pain Clinic Consult Note (Signed)
CONSULTATION    Date Time: 03/11/2014 3:45 PM  Patient Name: [name]  Requesting Physician: No att. 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    Evidence of lumbar spinal stenosis  Morbid obesity  DM- uncontrolled wit PP blood sugar 352 mg% 2 hors after meal.      Plan:   Will do epidural steroid injection once blood sugars are under control.    Discussed following aspects with respect to the procedure:    1.  Relationship between MRI findings and patient"s symptoms.  2.  Rationale for doing the procedure.  3.  Outcome data.  4. Onset and duration of action.  5. Description of the procedure.  6. Expectations and plan of action after the first procedure.  7.  Risks, benefits and alternative treatments.    Risks associate with the procedure included but not limited to bleeding, infection, may not work, pain may get worse, nerve injury, paralysis, allergic reactions, spinal headache, exposure to radiation, side effects related to steroids.    Side effects related to steroids included but not limited to increase in blood pressure, blood sugar, fluid retention, lowering of immune response, adverse effects on bones and possibility of weight gain.    Offered nutritional advice for better blood sugarontrol.    Answered all questions to patient's and her husband's satisfaction. Patient and husband expressed understanding with the plan. They have my contact information including cell phone number.          History:   [name] is a 48 y.o. female who presents to the hospital on 03/11/2014 with complain of right lower back pain wit right lower extremity radiation.  Last time i saw [name] was in 2009 when I performed one LESI at L4-5 level.  At thast time her pain was located in right lower back, buttock and thigh.  There was no radiation below knee. She said she noted excellent pain relief that  lasted for four years. Then her pain slowly started recurring but was always manageable with anti inflammatoty medicines until April this year when pain started getting worse.  Since May pain just would not go away in spite of everything she was doing.    Her current pain is located in her right lower back, radiates to lateral buttock, posterolateral and lateral thigh,  Posterolateral leg and stops at her ankle.    She described this pain as burning.    Severity while she was in the clinic was 2/10.  That is the best it can get.  Worst pain is 10/10.    Pain is aggrevated by standing, walking.  It is alleviated to some extent by lying down and when she sleeps with pillows between her legs.    She reported feeling tingly and numb in her right leg while walking.    Once in a while she reported feeling weak in leg.  She has experienced buckling in her knee and also experienced need to drag her righ tfoot.    She denied ant recent changes in bladder bowel function except some constipation.    Treatments tried so far include motrin like medicines which did not help, trabmdol made her depressed and caused constipation.  Flexeril made her sleepy and did not help with pain, She did not take oral steroids because they did nt help her in  the past.    Ongoing pain is affecting her physical activity.  Past Medical History:     Past Medical History   Diagnosis Date   . Diabetes mellitus without complication    . Hypertensive disorder    . Kidney stone    . PCOS (polycystic ovarian syndrome)        Past Surgical History:     Past Surgical History   Procedure Laterality Date   . Reduction mammaplasty  1991     Bilateral breast   . Hysterectomy  03/2009     done due to abnormal cells at time of endometrial biopsy       Family History:     Family History   Problem Relation Age of Onset   . Cancer Maternal Uncle      patient is not sure what type of cancer maternal uncle has   . Lung cancer Maternal Grandfather    . Breast cancer Neg  Hx    . Ovarian cancer Neg Hx        Social History:     History     Social History   . Marital Status: Married     Spouse Name: N/A     Number of Children: N/A   . Years of Education: N/A     Social History Main Topics   . Smoking status: Never Smoker    . Smokeless tobacco: Not on file   . Alcohol Use: Yes      Comment: Every 5-6 months.   . Drug Use: No   . Sexual Activity: Not on file     Other Topics Concern   . Not on file     Social History Narrative       Allergies:   No Known Allergies    Medications:     Nexium, metoprolol and metformin.  Dosed and regimen are in EPIC which I have reviewed.  Review of Systems:   As mentioned in HPI.  All other systems are negative.   Physical Exam:     Filed Vitals:    03/11/14 1123   BP: 160/87   Pulse: 102   Temp: 35.9 C (96.7 F)   Resp: 20   SpO2: 100%         Alert, oriented x 3  Normal speech  Screening mental status exam normal  VSS  No acute respiratory distress    Morbidly obese.    LE exam     Sensory:  Right- paresthesia along lateral thigh.  Rest WNL on both sides    Motor:  5/5 both sides    L4 patellar reflex;   Absent both sides    S1 ankle reflex:     Absent both sides  Plantars:     Down going both sides    Clonus:   Absent bilaterally.    ML tenderness:  present    Paramedian tenderness:       Right    present                       Left- absent    SI region tenderness: none on both sides  Myoifascial tenderness:      ROM  L- spine      ROM C- spine   I         Results    Procedure Component Value Units Date/Time    Glucose Whole Blood - POCT [[id number]]  (Abnormal) Collected:  03/11/14 1225     POCT - Glucose Whole blood 352 (H) mg/dL Updated:  16/10/96 0454            Rads:       INDICATION: Spinal stenosis.  TECHNIQUE: Sagittal T1 weighted, T2 weighted, and STIR images were  obtained along with axial T1 and T2 weighted images.  FINDINGS: The L1-2 and L2-3 levels are within normal limits.  The L3-4 and L4-5 intervertebral discs exhibit decreased  signal  intensity on long TR images compatible with disc desiccation.  At the L3-4 level, there is a mild disc bulge. There are severe  bilateral ligamentous and facet hypertrophy. A moderate to severe  central canal stenosis is noted. There are moderate bilateral neural  foraminal stenoses.  At the L4-5 level, there is a central disc protrusion. There is moderate  to severe bilateral ligamentous and facet hypertrophy. A severe central  canal stenosis is noted. There are moderate bilateral neural foraminal  stenoses.  At the L5-S1 level, there is mild bilateral facet hypertrophy.  The conus medullaris is normal in shape and signal intensity.  The bone marrow of the lumbosacral spine exhibits normal signal  intensity.  IMPRESSION:   Moderate to severe central canal stenosis at the L3-4 level.  Central disc protrusion at the L4-5 level. Severe central canal stenosis  at the L4-5 level.  Merri Ray, MD   02/24/2014 7:31 AM      I appreciate allowing me to participate in care your patients.  Thank you for this referral.    Sincerely,    Tally Due, MD  03/11/2014  6:36 PM        Signed by: Chriss Mannan A Oreta Soloway

## 2014-03-12 ENCOUNTER — Encounter: Payer: Self-pay | Admitting: Anesthesiology

## 2014-05-04 ENCOUNTER — Ambulatory Visit: Admission: RE | Admit: 2014-05-04 | Payer: BLUE CROSS/BLUE SHIELD | Source: Ambulatory Visit | Admitting: Anesthesiology

## 2014-05-04 ENCOUNTER — Encounter: Admission: RE | Payer: Self-pay | Source: Ambulatory Visit

## 2014-05-04 SURGERY — AX PAIN CLINIC REQUEST
Anesthesia: Local

## 2015-05-11 ENCOUNTER — Other Ambulatory Visit: Payer: Self-pay | Admitting: Internal Medicine

## 2015-05-13 ENCOUNTER — Other Ambulatory Visit: Payer: Self-pay | Admitting: Internal Medicine

## 2015-05-13 DIAGNOSIS — M519 Unspecified thoracic, thoracolumbar and lumbosacral intervertebral disc disorder: Secondary | ICD-10-CM

## 2015-05-24 ENCOUNTER — Ambulatory Visit: Payer: BLUE CROSS/BLUE SHIELD | Attending: Internal Medicine

## 2015-05-24 DIAGNOSIS — M47817 Spondylosis without myelopathy or radiculopathy, lumbosacral region: Secondary | ICD-10-CM | POA: Insufficient documentation

## 2015-05-24 DIAGNOSIS — M545 Low back pain: Secondary | ICD-10-CM | POA: Insufficient documentation

## 2015-05-24 DIAGNOSIS — M5106 Intervertebral disc disorders with myelopathy, lumbar region: Secondary | ICD-10-CM | POA: Insufficient documentation

## 2015-05-24 DIAGNOSIS — M519 Unspecified thoracic, thoracolumbar and lumbosacral intervertebral disc disorder: Secondary | ICD-10-CM

## 2015-05-24 DIAGNOSIS — M4806 Spinal stenosis, lumbar region: Secondary | ICD-10-CM | POA: Insufficient documentation

## 2016-01-16 ENCOUNTER — Ambulatory Visit (INDEPENDENT_AMBULATORY_CARE_PROVIDER_SITE_OTHER): Payer: Self-pay | Admitting: Cardiovascular Disease

## 2016-01-16 ENCOUNTER — Other Ambulatory Visit: Payer: Self-pay | Admitting: Cardiovascular Disease

## 2016-01-16 DIAGNOSIS — R0602 Shortness of breath: Secondary | ICD-10-CM

## 2016-01-19 ENCOUNTER — Other Ambulatory Visit: Payer: Self-pay | Admitting: Cardiovascular Disease

## 2016-01-19 DIAGNOSIS — R9431 Abnormal electrocardiogram [ECG] [EKG]: Secondary | ICD-10-CM

## 2016-01-23 ENCOUNTER — Ambulatory Visit
Admission: RE | Admit: 2016-01-23 | Discharge: 2016-01-23 | Disposition: A | Discharge status: Home / Self Care | Payer: BLUE CROSS/BLUE SHIELD | Source: Ambulatory Visit | Attending: Cardiovascular Disease | Admitting: Cardiovascular Disease

## 2016-01-23 ENCOUNTER — Other Ambulatory Visit (INDEPENDENT_AMBULATORY_CARE_PROVIDER_SITE_OTHER): Payer: Self-pay

## 2016-01-23 DIAGNOSIS — R0602 Shortness of breath: Secondary | ICD-10-CM | POA: Insufficient documentation

## 2016-01-23 DIAGNOSIS — R9431 Abnormal electrocardiogram [ECG] [EKG]: Secondary | ICD-10-CM | POA: Insufficient documentation

## 2016-01-23 DIAGNOSIS — I5189 Other ill-defined heart diseases: Secondary | ICD-10-CM | POA: Insufficient documentation

## 2016-01-30 ENCOUNTER — Other Ambulatory Visit (INDEPENDENT_AMBULATORY_CARE_PROVIDER_SITE_OTHER): Payer: Self-pay

## 2016-01-30 ENCOUNTER — Inpatient Hospital Stay
Admission: RE | Admit: 2016-01-30 | Discharge: 2016-01-30 | Disposition: A | Discharge status: Home / Self Care | Payer: BLUE CROSS/BLUE SHIELD | Source: Ambulatory Visit | Attending: Cardiovascular Disease | Admitting: Cardiovascular Disease

## 2016-01-30 DIAGNOSIS — R9431 Abnormal electrocardiogram [ECG] [EKG]: Secondary | ICD-10-CM

## 2016-01-30 DIAGNOSIS — R931 Abnormal findings on diagnostic imaging of heart and coronary circulation: Secondary | ICD-10-CM | POA: Insufficient documentation

## 2016-01-30 MED ORDER — TECHNETIUM TC 99M TETROFOSMIN INJECTION
1.0000 | Freq: Once | Status: AC | PRN
Start: 2016-01-30 — End: 2016-01-30
  Administered 2016-01-30: 1 via INTRAVENOUS

## 2016-02-14 ENCOUNTER — Ambulatory Visit (INDEPENDENT_AMBULATORY_CARE_PROVIDER_SITE_OTHER): Payer: Self-pay | Admitting: Cardiovascular Disease

## 2016-02-14 LAB — VAHRT HISTORIC LVEF
Ejection Fraction: 50 %
Ejection Fraction: 60 %

## 2016-09-12 ENCOUNTER — Ambulatory Visit (INDEPENDENT_AMBULATORY_CARE_PROVIDER_SITE_OTHER): Payer: Self-pay | Admitting: Cardiovascular Disease

## 2016-09-12 ENCOUNTER — Emergency Department: Payer: BLUE CROSS/BLUE SHIELD

## 2016-09-12 ENCOUNTER — Emergency Department
Admission: EM | Admit: 2016-09-12 | Discharge: 2016-09-12 | Disposition: A | Discharge status: Home / Self Care | Payer: BLUE CROSS/BLUE SHIELD | Attending: Emergency Medicine | Admitting: Emergency Medicine

## 2016-09-12 DIAGNOSIS — E119 Type 2 diabetes mellitus without complications: Secondary | ICD-10-CM | POA: Insufficient documentation

## 2016-09-12 DIAGNOSIS — Z9884 Bariatric surgery status: Secondary | ICD-10-CM | POA: Insufficient documentation

## 2016-09-12 DIAGNOSIS — Z794 Long term (current) use of insulin: Secondary | ICD-10-CM | POA: Insufficient documentation

## 2016-09-12 DIAGNOSIS — R0600 Dyspnea, unspecified: Secondary | ICD-10-CM

## 2016-09-12 DIAGNOSIS — R0609 Other forms of dyspnea: Secondary | ICD-10-CM

## 2016-09-12 DIAGNOSIS — I10 Essential (primary) hypertension: Secondary | ICD-10-CM | POA: Insufficient documentation

## 2016-09-12 DIAGNOSIS — Z79899 Other long term (current) drug therapy: Secondary | ICD-10-CM | POA: Insufficient documentation

## 2016-09-12 LAB — HEMOLYSIS INDEX: Hemolysis Index: 7 (ref 0–18)

## 2016-09-12 LAB — URINALYSIS, REFLEX TO MICROSCOPIC EXAM IF INDICATED
Blood, UA: NEGATIVE
Glucose, UA: NEGATIVE
Ketones UA: 80
Nitrite, UA: NEGATIVE
Protein, UR: 100 — AB
Specific Gravity UA: 1.029 (ref 1.001–1.035)
Urine pH: 5 (ref 5.0–8.0)
Urobilinogen, UA: 4 mg/dL — AB

## 2016-09-12 LAB — COMPREHENSIVE METABOLIC PANEL
ALT: 46 U/L (ref 0–55)
AST (SGOT): 47 U/L — ABNORMAL HIGH (ref 5–34)
Albumin/Globulin Ratio: 1.1 (ref 0.9–2.2)
Albumin: 3.6 g/dL (ref 3.5–5.0)
Alkaline Phosphatase: 82 U/L (ref 37–106)
Anion Gap: 15 (ref 5.0–15.0)
BUN: 12 mg/dL (ref 7.0–19.0)
Bilirubin, Total: 0.7 mg/dL (ref 0.2–1.2)
CO2: 25 mEq/L (ref 22–29)
Calcium: 9.3 mg/dL (ref 8.5–10.5)
Chloride: 102 mEq/L (ref 100–111)
Creatinine: 0.9 mg/dL (ref 0.6–1.0)
Globulin: 3.4 g/dL (ref 2.0–3.6)
Glucose: 120 mg/dL — ABNORMAL HIGH (ref 70–100)
Potassium: 3.6 mEq/L (ref 3.5–5.1)
Protein, Total: 7 g/dL (ref 6.0–8.3)
Sodium: 142 mEq/L (ref 136–145)

## 2016-09-12 LAB — ECG 12-LEAD
Atrial Rate: 78 {beats}/min
P Axis: 51 degrees
P-R Interval: 172 ms
Q-T Interval: 368 ms
QRS Duration: 64 ms
QTC Calculation (Bezet): 419 ms
R Axis: -11 degrees
T Axis: 19 degrees
Ventricular Rate: 78 {beats}/min

## 2016-09-12 LAB — CBC AND DIFFERENTIAL
Absolute NRBC: 0 10*3/uL
Basophils Absolute Automated: 0.06 10*3/uL (ref 0.00–0.20)
Basophils Automated: 1.2 %
Eosinophils Absolute Automated: 0.22 10*3/uL (ref 0.00–0.70)
Eosinophils Automated: 4.3 %
Hematocrit: 43.2 % (ref 37.0–47.0)
Hgb: 14 g/dL (ref 12.0–16.0)
Immature Granulocytes Absolute: 0.01 10*3/uL
Immature Granulocytes: 0.2 %
Lymphocytes Absolute Automated: 2.4 10*3/uL (ref 0.50–4.40)
Lymphocytes Automated: 46.4 %
MCH: 29.2 pg (ref 28.0–32.0)
MCHC: 32.4 g/dL (ref 32.0–36.0)
MCV: 90 fL (ref 80.0–100.0)
MPV: 11.7 fL (ref 9.4–12.3)
Monocytes Absolute Automated: 0.66 10*3/uL (ref 0.00–1.20)
Monocytes: 12.8 %
Neutrophils Absolute: 1.82 10*3/uL (ref 1.80–8.10)
Neutrophils: 35.1 %
Nucleated RBC: 0 /100 WBC (ref 0.0–1.0)
Platelets: 200 10*3/uL (ref 140–400)
RBC: 4.8 10*6/uL (ref 4.20–5.40)
RDW: 12 % (ref 12–15)
WBC: 5.17 10*3/uL (ref 3.50–10.80)

## 2016-09-12 LAB — GFR: EGFR: >60

## 2016-09-12 LAB — LIPASE: Lipase: 58 U/L (ref 8–78)

## 2016-09-12 LAB — TROPONIN I: Troponin I: <0.01 ng/mL (ref 0.00–0.09)

## 2016-09-12 LAB — IHS D-DIMER: D-Dimer: 3.02 ug/mL FEU — ABNORMAL HIGH (ref 0.00–0.51)

## 2016-09-12 MED ORDER — IOPAMIDOL 76 % IV SOLN
100.0000 mL | Freq: Once | INTRAVENOUS | Status: AC | PRN
Start: 2016-09-12 — End: 2016-09-12
  Administered 2016-09-12: 100 mL via INTRAVENOUS

## 2016-09-12 MED ORDER — ESOMEPRAZOLE MAGNESIUM 20 MG PO CPDR
20.0000 mg | DELAYED_RELEASE_CAPSULE | Freq: Every morning | ORAL | 0 refills | Status: AC
Start: 2016-09-12 — End: ?

## 2016-09-12 NOTE — ED Triage Notes (Signed)
Patient c/o SOB worse with ambulation x1 week. 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  Patient reports no abdominal pain or chest pain.  No cough.  Patient reports having acid reflux causing vomiting.  Prescribed protonix by MD without relief.

## 2016-09-12 NOTE — Discharge Instructions (Signed)
Shortness of Breath, Unclear Etiology    You have been seen today for shortness of breath, or difficulty breathing; but we don't know the cause.    This means that after talking about your medical problems, doing a physical exam, and looking at the tests that were done, there is still not a clear explanation as to why you are short of breath. You may need to go see your doctor for another exam or more tests to find out why you are short of breath. .    CAUTION: Even after a workup, including EKGs, lab tests, x-rays and even CAT Scans (CT scans), it is still possible to have a serious problem that cannot be detected at first.    The doctor caring for you feels that the cause of your shortness of breath is not from a life-threatening problem.    Some of the more dangerous medical problems such as a heart attack, blood clot in the lung (pulmonary embolism), asthma, collapsed lung, heart failure, etc. have been looked at, but are not felt to be the cause of your shortness of breath.    Shortness of breath is a serious symptom and must be handled carefully. It is VERY IMPORTANT that you see your regular doctor and return for re-evaluation or seek medical attention immediately if your symptoms become worse or change.    YOU SHOULD SEEK MEDICAL ATTENTION IMMEDIATELY, EITHER HERE OR AT THE NEAREST EMERGENCY DEPARTMENT, IF ANY OF THE FOLLOWING OCCURS:   Your shortness of breath returns.   You notice that your shortness of breath happens as you walk, go up stairs, or exert yourself.   If you develop chest pain, sweating, or nausea (feel sick to your stomach).   Rapid heartbeat.   You have any weakness, lightheadedness or you get so short of breath that you pass out.   It hurts to breathe.   Your leg swells.   Any other worsening symptoms or problems.   If you are unable to sleep because of shortness of breath, or you wake in the middle of the night short of breath.

## 2016-09-12 NOTE — ED Provider Notes (Signed)
EMERGENCY DEPARTMENT HISTORY AND PHYSICAL EXAM     Physician/Midlevel provider first contact with patient: 09/12/16 1610         Date: 09/12/2016  Patient Name: [name]    History of Presenting Illness     Chief Complaint   Patient presents with   . Shortness of Breath       History Provided By: Patient    Chief Complaint: SOB  Duration: Since December 13th  Timing:  Intermittent  Location: Respiratory  Severity: Moderate  Exacerbating factors: Worse with exertion  Alleviating factors: None  Pertinent Negatives: No cough, abdominal pain, or fever    Additional History: [name] is a 50 y.o. female with a hx of DM and HTN presenting to the ED complaining of intermittent SOB, which has been occurring since December 13th. Pt notes the SOB becomes worse with exertion. Of note, pt had surgery for a gastric sleeve on December 5th with the SOB beginning shortly afterwards. Pt went to see her surgeon on December 21st, where she had an outpatient CT performed (negative for PE). Pt states she has had a stress test performed in the recent past; however, is unsure of the results. Pt went to see her PCP PTA, who referred the pt to the ED. Pt does not smoke and denies any hx of asthma. Pt denies cough, abdominal pain, or fever.       PCP: Rudene Anda, MD  SPECIALISTS:    No current facility-administered medications for this encounter.      Current Outpatient Prescriptions   Medication Sig Dispense Refill   . insulin detemir (LEVEMIR) 100 UNIT/ML injection Inject 15 Units into the skin.     . pantoprazole (PROTONIX) 40 MG tablet Take 40 mg by mouth daily.     Marland Kitchen esomeprazole (NEXIUM) 20 MG capsule Take 1 capsule (20 mg total) by mouth every morning before breakfast. 60 capsule 0   . insulin lispro (HUMALOG) 100 UNIT/ML injection Inject into the skin 3 (three) times daily before meals.     . Liraglutide (VICTOZA SC) Inject into the skin.     . metFORMIN (GLUCOPHAGE) 1000 MG tablet Take 1,000 mg by mouth 2 (two) times  daily with meals.        . Metoprolol Succinate (TOPROL XL PO) Take by mouth daily.         Past History     Past Medical History:  Past Medical History:   Diagnosis Date   . Diabetes mellitus without complication    . Hypertensive disorder    . Kidney stone    . PCOS (polycystic ovarian syndrome)        Past Surgical History:  Past Surgical History:   Procedure Laterality Date   . AX PAIN CLINIC REQUEST N/A 03/11/2014    Procedure: AX PAIN CLINIC REQUEST;  Surgeon: Tally Due, MD;  Location: [facility];  Service: Anesthesiology;  Laterality: N/A;  new/ return pt   . HYSTERECTOMY  03/2009    done due to abnormal cells at time of endometrial biopsy   . LAPAROSCOPIC, GASTRIC BYPASS, CONVERSION FROM SLEEVE GASTRECTOMY     . REDUCTION MAMMAPLASTY  1991    Bilateral breast       Family History:  Family History   Problem Relation Age of Onset   . Cancer Maternal Uncle      patient is not sure what type of cancer maternal uncle has   . Lung cancer Maternal Grandfather    .  Breast cancer Neg Hx    . Ovarian cancer Neg Hx        Social History:  Social History   Substance Use Topics   . Smoking status: Never Smoker   . Smokeless tobacco: Never Used   . Alcohol use Yes      Comment: Every 5-6 months.       Allergies:  No Known Allergies    Review of Systems     Review of Systems   Constitutional: Negative for fever.   Respiratory: Positive for shortness of breath. Negative for cough.    Gastrointestinal: Negative for abdominal pain.   All other systems reviewed and are negative.        Physical Exam   BP 130/78   Pulse 80   Temp (!) 96.8 F (36 C)   Resp 18   Ht 5\' 7"  (1.702 m)   Wt 100.7 kg   SpO2 100%   BMI 34.77 kg/m     Physical Exam   Constitutional: Patient is alert.  Well nourished.  NAD  Head: Atraumatic.   Eyes: EOMI. PERRL  ENT:  MMM.   Neck:  Supple.   Cardiovascular: Normal rate and regular rhythm.   Pulmonary/Chest: Effort normal and breath sounds normal. No respiratory distress.   Abdominal: Soft.  There is no tenderness. Bowel sounds present and normal.    Musculoskeletal:  No lower extremity edema or tenderness.    Neurological: Patient is alert and oriented to person, place, and time.  No focal deficits.   Skin: Skin is warm and dry.    Diagnostic Study Results     Labs -     Results     Procedure Component Value Units Date/Time    UA, Reflex to Microscopic [[id number]]  (Abnormal) Collected:  09/12/16 0944    Specimen:  Urine Updated:  09/12/16 1149     Urine Type Clean Catch     Color, UA Yellow     Clarity, UA Sl Cloudy (A)     Specific Gravity UA 1.029     Urine pH 5.0     Leukocyte Esterase, UA Moderate (A)     Nitrite, UA Negative     Protein, UR 100 (A)     Glucose, UA Negative     Ketones UA 80     Urobilinogen, UA 4.0 (A) mg/dL      Bilirubin, UA Small (A)     Blood, UA Negative     RBC, UA 6 - 10 (A) /hpf      WBC, UA TNTC (A) /hpf      Squamous Epithelial Cells, Urine 26 - 50 (A) /hpf      Renal Epithel, UA 0 - 2 /hpf      Trans Epithel, UA 0 - 2 /hpf      Hyaline Casts, UA 0 - 3 /lpf      Urine Mucus Present    D-Dimer [[id number]]  (Abnormal) Collected:  09/12/16 0944     Updated:  09/12/16 1057     D-Dimer 3.02 (H) ug/mL FEU     Troponin I [[id number]] Collected:  09/12/16 0944    Specimen:  Blood Updated:  09/12/16 1021     Troponin I <0.01 ng/mL     Comprehensive Metabolic Panel (CMP) [[id number]]  (Abnormal) Collected:  09/12/16 0944    Specimen:  Blood Updated:  09/12/16 1015     Glucose 120 (H) mg/dL      BUN 62.9 mg/dL  Creatinine 0.9 mg/dL      Sodium 425 mEq/L      Potassium 3.6 mEq/L      Chloride 102 mEq/L      CO2 25 mEq/L      Calcium 9.3 mg/dL      Protein, Total 7.0 g/dL      Albumin 3.6 g/dL      AST (SGOT) 47 (H) U/L      ALT 46 U/L      Alkaline Phosphatase 82 U/L      Bilirubin, Total 0.7 mg/dL      Globulin 3.4 g/dL      Albumin/Globulin Ratio 1.1     Anion Gap 15.0    Lipase [[id number]] Collected:  09/12/16 0944    Specimen:  Blood Updated:  09/12/16 1015     Lipase 58 U/L      Hemolysis index [[id number]] Collected:  09/12/16 0944     Updated:  09/12/16 1015     Hemolysis Index 7    GFR [[id number]] Collected:  09/12/16 0944     Updated:  09/12/16 1015     EGFR >60.0    CBC and differential [[id number]] Collected:  09/12/16 0944    Specimen:  Blood from Blood Updated:  09/12/16 0958     WBC 5.17 x10 3/uL      Hgb 14.0 g/dL      Hematocrit 73.2 %      Platelets 200 x10 3/uL      RBC 4.80 x10 6/uL      MCV 90.0 fL      MCH 29.2 pg      MCHC 32.4 g/dL      RDW 12 %      MPV 11.7 fL      Neutrophils 35.1 %      Lymphocytes Automated 46.4 %      Monocytes 12.8 %      Eosinophils Automated 4.3 %      Basophils Automated 1.2 %      Immature Granulocyte 0.2 %      Nucleated RBC 0.0 /100 WBC      Neutrophils Absolute 1.82 x10 3/uL      Abs Lymph Automated 2.40 x10 3/uL      Abs Mono Automated 0.66 x10 3/uL      Abs Eos Automated 0.22 x10 3/uL      Absolute Baso Automated 0.06 x10 3/uL      Absolute Immature Granulocyte 0.01 x10 3/uL      Absolute NRBC 0.00 x10 3/uL           Radiologic Studies -   Radiology Results (24 Hour)     Procedure Component Value Units Date/Time    CT Angio Chest [[id number]] Collected:  09/12/16 1207    Order Status:  Completed Updated:  09/12/16 1213    Narrative:       HISTORY: Acute chest pain.    TECHNIQUE: Enhanced, computed tomography of the chest was performed at  3mm slice thickness. 3 D post processing was performed. A combination of  automatic exposure control, adjustment of the mA a and/or KVP according  to the patient's size and or use of iterative reconstruction technique  was utilized.      PRIOR: No prior studies.    FINDINGS:     There are no finding to suggest pulmonary embolism. There is apparent  right subclavian artery, normal variant.  The lung fields are clear. The main airways are maintained.  There are no pleural effusions.   Evaluation of mediastinum shows no evidence of lymph nodes or enlarged  masses.   The great vessels are normal in caliber.    The heart is mildly increased in size and shape without pericardial  effusions.   There are mild degenerative changes of the thoracic spine.      Impression:        No acute findings identified.    Georgana Curio, MD   09/12/2016 12:09 PM    Chest 2 Views [[id number]] Collected:  09/12/16 0958    Order Status:  Completed Updated:  09/12/16 1002    Narrative:       Frontal and lateral chest x-ray    Indication: Shortness of breath    Comparison: None    Findings: Cardio mediastinal silhouette is within normal limits. Lungs  are clear. No pneumonia, vascular congestion, or pleural effusion.     Bones are unremarkable.      Impression:        No acute cardiopulmonary findings.    Lorinda Creed, MD   09/12/2016 9:58 AM      .    Medical Decision Making   I am the first provider for this patient.    I reviewed the vital signs, available nursing notes, past medical history, past surgical history, family history and social history.    Vital Signs-Reviewed the patient's vital signs.     Patient Vitals for the past 12 hrs:   BP Temp Pulse Resp   09/12/16 1106 130/78 (!) 96.8 F (36 C) 80 18   09/12/16 0921 154/83 (!) 96.3 F (35.7 C) 81 16       Pulse Oximetry Analysis - Normal 100% on RA    EKG:  Interpreted by the EP.   Time Interpreted: 9:09   Rate: 78   Rhythm: Normal Sinus Rhythm    Interpretation: Nonspecific T wave flattening   Comparison: Compared to prior on 03/16/10, T wave flattening is now more pronounced    Old Medical Records: Nursing notes.     ED Course:   10:37 AM - Discussed pt case with Dr. Letitia Neri, cardiology, who will review the pt's records and will call back.    10:48 AM - Discussed pt case with Dr. Letitia Neri, cardiology, who will follow up with the pt in the next couple days.    10:49 AM - Pt resting comfortably, in no acute distress. Updated pt on blood work and chest X-ray results.     12:21 PM - Pt resting comfortably in bed. Updated pt on CTA results. Addressed all questions and concerns at  great length.     12:29 PM - Pt counseled on diagnosis, f/u plans with PCP and cardiology, medication use, and signs and symptoms when to return to ED.  Pt is stable and ready for discharge.     Provider Notes: DOE increased after gastric sleeve.  CTA believed to be neg, but unable to obtain records.  Dimer elevated.  CTA here neg.  No CP.  Feels fine at rest.  Reviewed previous Thallium w/?small reversible defect.  D/w Dr. Marcelle Smiling, will setup appt for patient in clinic for today or tomorrow.      Diagnosis     Clinical Impression:   1. Dyspnea on exertion    2. 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    Condition at disposition: Stable            _______________________________      Attestations: This note is prepared by Ree Edman, acting as scribe for Ulice Bold, MD.    Ulice Bold, MD - The scribe's documentation has been prepared under my direction and personally reviewed by me in its entirety.  I confirm that the note above accurately reflects all work, treatment, procedures, and medical decision making performed by me.    _______________________________     Sharyne Richters, MD  09/13/16 [phone]

## 2016-09-14 ENCOUNTER — Other Ambulatory Visit: Payer: Self-pay | Admitting: Cardiovascular Disease

## 2016-09-14 DIAGNOSIS — R0602 Shortness of breath: Secondary | ICD-10-CM

## 2016-09-20 ENCOUNTER — Ambulatory Visit: Payer: BLUE CROSS/BLUE SHIELD

## 2016-09-21 ENCOUNTER — Other Ambulatory Visit: Payer: Self-pay | Admitting: Cardiovascular Disease

## 2016-09-21 DIAGNOSIS — R0602 Shortness of breath: Secondary | ICD-10-CM

## 2016-09-28 ENCOUNTER — Ambulatory Visit
Admission: RE | Admit: 2016-09-28 | Discharge: 2016-09-28 | Disposition: A | Discharge status: Home / Self Care | Payer: BLUE CROSS/BLUE SHIELD | Source: Ambulatory Visit | Attending: Cardiovascular Disease | Admitting: Cardiovascular Disease

## 2016-09-28 DIAGNOSIS — R9431 Abnormal electrocardiogram [ECG] [EKG]: Secondary | ICD-10-CM | POA: Insufficient documentation

## 2016-09-28 DIAGNOSIS — R0602 Shortness of breath: Secondary | ICD-10-CM | POA: Insufficient documentation

## 2018-02-17 ENCOUNTER — Other Ambulatory Visit: Payer: Self-pay | Admitting: Internal Medicine

## 2018-02-17 DIAGNOSIS — M542 Cervicalgia: Secondary | ICD-10-CM

## 2018-02-17 DIAGNOSIS — M545 Low back pain: Secondary | ICD-10-CM

## 2018-02-21 ENCOUNTER — Other Ambulatory Visit: Payer: Self-pay

## 2018-02-28 ENCOUNTER — Ambulatory Visit: Payer: BLUE CROSS/BLUE SHIELD | Attending: Internal Medicine

## 2018-02-28 DIAGNOSIS — M48061 Spinal stenosis, lumbar region without neurogenic claudication: Secondary | ICD-10-CM | POA: Insufficient documentation

## 2018-02-28 DIAGNOSIS — M47816 Spondylosis without myelopathy or radiculopathy, lumbar region: Secondary | ICD-10-CM | POA: Insufficient documentation

## 2018-02-28 DIAGNOSIS — M545 Low back pain: Secondary | ICD-10-CM | POA: Insufficient documentation

## 2018-03-01 ENCOUNTER — Ambulatory Visit: Payer: BLUE CROSS/BLUE SHIELD | Attending: Internal Medicine

## 2018-03-01 DIAGNOSIS — M898X8 Other specified disorders of bone, other site: Secondary | ICD-10-CM | POA: Insufficient documentation

## 2018-03-01 DIAGNOSIS — M4712 Other spondylosis with myelopathy, cervical region: Secondary | ICD-10-CM | POA: Insufficient documentation

## 2018-03-01 DIAGNOSIS — M4812 Ankylosing hyperostosis [Forestier], cervical region: Secondary | ICD-10-CM | POA: Insufficient documentation

## 2018-03-01 DIAGNOSIS — M4722 Other spondylosis with radiculopathy, cervical region: Secondary | ICD-10-CM | POA: Insufficient documentation

## 2018-03-01 DIAGNOSIS — M542 Cervicalgia: Secondary | ICD-10-CM | POA: Insufficient documentation

## 2018-06-30 ENCOUNTER — Emergency Department
Admission: EM | Admit: 2018-06-30 | Discharge: 2018-06-30 | Disposition: A | Discharge status: Home / Self Care | Payer: BLUE CROSS/BLUE SHIELD | Attending: Emergency Medicine | Admitting: Emergency Medicine

## 2018-06-30 ENCOUNTER — Emergency Department: Payer: BLUE CROSS/BLUE SHIELD

## 2018-06-30 DIAGNOSIS — R109 Unspecified abdominal pain: Secondary | ICD-10-CM

## 2018-06-30 DIAGNOSIS — Z794 Long term (current) use of insulin: Secondary | ICD-10-CM | POA: Insufficient documentation

## 2018-06-30 DIAGNOSIS — Z7984 Long term (current) use of oral hypoglycemic drugs: Secondary | ICD-10-CM | POA: Insufficient documentation

## 2018-06-30 DIAGNOSIS — N134 Hydroureter: Secondary | ICD-10-CM

## 2018-06-30 DIAGNOSIS — R030 Elevated blood-pressure reading, without diagnosis of hypertension: Secondary | ICD-10-CM

## 2018-06-30 DIAGNOSIS — Z87442 Personal history of urinary calculi: Secondary | ICD-10-CM | POA: Insufficient documentation

## 2018-06-30 DIAGNOSIS — N1330 Unspecified hydronephrosis: Secondary | ICD-10-CM | POA: Insufficient documentation

## 2018-06-30 DIAGNOSIS — E119 Type 2 diabetes mellitus without complications: Secondary | ICD-10-CM | POA: Insufficient documentation

## 2018-06-30 DIAGNOSIS — I10 Essential (primary) hypertension: Secondary | ICD-10-CM | POA: Insufficient documentation

## 2018-06-30 DIAGNOSIS — R1032 Left lower quadrant pain: Secondary | ICD-10-CM | POA: Insufficient documentation

## 2018-06-30 LAB — URINALYSIS, REFLEX TO MICROSCOPIC EXAM IF INDICATED
Bilirubin, UA: NEGATIVE
Blood, UA: NEGATIVE
Glucose, UA: NEGATIVE
Ketones UA: NEGATIVE
Leukocyte Esterase, UA: NEGATIVE
Nitrite, UA: NEGATIVE
Protein, UR: NEGATIVE
Specific Gravity UA: 1.02 (ref 1.001–1.035)
Urine pH: 5 (ref 5.0–8.0)
Urobilinogen, UA: NEGATIVE mg/dL (ref 0.2–2.0)

## 2018-06-30 LAB — COMPREHENSIVE METABOLIC PANEL
ALT: 14 U/L (ref 0–55)
AST (SGOT): 17 U/L (ref 5–34)
Albumin/Globulin Ratio: 1.3 (ref 0.9–2.2)
Albumin: 3.7 g/dL (ref 3.5–5.0)
Alkaline Phosphatase: 119 U/L — ABNORMAL HIGH (ref 37–106)
Anion Gap: 9 (ref 5.0–15.0)
BUN: 7 mg/dL (ref 7.0–19.0)
Bilirubin, Total: 0.5 mg/dL (ref 0.2–1.2)
CO2: 25 mEq/L (ref 22–29)
Calcium: 9 mg/dL (ref 8.5–10.5)
Chloride: 106 mEq/L (ref 100–111)
Creatinine: 0.7 mg/dL (ref 0.6–1.0)
Globulin: 2.9 g/dL (ref 2.0–3.6)
Glucose: 170 mg/dL — ABNORMAL HIGH (ref 70–100)
Potassium: 3.8 mEq/L (ref 3.5–5.1)
Protein, Total: 6.6 g/dL (ref 6.0–8.3)
Sodium: 140 mEq/L (ref 136–145)

## 2018-06-30 LAB — CBC AND DIFFERENTIAL
Absolute NRBC: 0 10*3/uL (ref 0.00–0.00)
Basophils Absolute Automated: 0.04 10*3/uL (ref 0.00–0.08)
Basophils Automated: 0.6 %
Eosinophils Absolute Automated: 0.07 10*3/uL (ref 0.00–0.44)
Eosinophils Automated: 1 %
Hematocrit: 40.1 % (ref 34.7–43.7)
Hgb: 12.4 g/dL (ref 11.4–14.8)
Immature Granulocytes Absolute: 0.02 10*3/uL (ref 0.00–0.07)
Immature Granulocytes: 0.3 %
Lymphocytes Absolute Automated: 2.95 10*3/uL (ref 0.42–3.22)
Lymphocytes Automated: 43.3 %
MCH: 28.2 pg (ref 25.1–33.5)
MCHC: 30.9 g/dL — ABNORMAL LOW (ref 31.5–35.8)
MCV: 91.3 fL (ref 78.0–96.0)
MPV: 10.6 fL (ref 8.9–12.5)
Monocytes Absolute Automated: 0.62 10*3/uL (ref 0.21–0.85)
Monocytes: 9.1 %
Neutrophils Absolute: 3.12 10*3/uL (ref 1.10–6.33)
Neutrophils: 45.7 %
Nucleated RBC: 0 /100 WBC (ref 0.0–0.0)
Platelets: 222 10*3/uL (ref 142–346)
RBC: 4.39 10*6/uL (ref 3.90–5.10)
RDW: 12 % (ref 11–15)
WBC: 6.82 10*3/uL (ref 3.10–9.50)

## 2018-06-30 LAB — LIPASE: Lipase: 28 U/L (ref 8–78)

## 2018-06-30 LAB — GFR: EGFR: >60

## 2018-06-30 LAB — HEMOLYSIS INDEX: Hemolysis Index: 7 (ref 0–18)

## 2018-06-30 LAB — URINE BHCG POC: Urine bHCG POC: NEGATIVE

## 2018-06-30 MED ORDER — KETOROLAC TROMETHAMINE 15 MG/ML IJ SOLN
15.00 mg | Freq: Once | INTRAMUSCULAR | Status: AC
Start: 2018-06-30 — End: 2018-06-30
  Administered 2018-06-30: 13:00:00 15 mg via INTRAVENOUS
  Filled 2018-06-30: qty 1

## 2018-06-30 MED ORDER — IOHEXOL 350 MG/ML IV SOLN
100.00 mL | Freq: Once | INTRAVENOUS | Status: AC | PRN
Start: 2018-06-30 — End: 2018-06-30
  Administered 2018-06-30: 14:00:00 100 mL via INTRAVENOUS

## 2018-06-30 MED ORDER — IBUPROFEN 600 MG PO TABS
600.00 mg | ORAL_TABLET | Freq: Three times a day (TID) | ORAL | 0 refills | Status: AC | PRN
Start: 2018-06-30 — End: 2018-07-05

## 2018-06-30 NOTE — Discharge Instructions (Signed)
You were seen in the [facility] Emergency Department by Dr. Reginia Forts    For home care, please take Motrin as directed and prescribed for pain management.  Please follow-up with your primary care doctor in the next 1 to 2 days for reevaluation of your blood pressure.  Please follow-up with a urologist for continued management and evaluation of left-sided chronic hydronephrosis and hydroureter as discussed.    Please make sure to return if you have return of abdominal pain, pelvic pain, vaginal bleeding or vaginal discharge, fever greater than 100.4 F, back pain, flank pain, repeated vomiting, chest pain, shortness of breath, lightheadedness or dizziness, if you are not getting better, or any other new or worsening or concerning symptoms.  We are always open and happy to help.    Please be aware that an emergency department diagnosis is usually preliminary, and that a definitive diagnosis cannot be made without the help of time or further testing.  Every disease has a progression and may take time before it is recognizable by a physician.  It is extremely important that you return to the Emergency Department or see your own doctor if you do not improve, or especially if your symptoms worsen or change.     In short, do not hesitate to return to the emergency department if you sense something is not right. We are never upset to see you again.    Take your discharge instructions to your primary care doctor and all other follow-up care so they have a record of what happened in your visit today. Emergency care is not a substitute for general medical care.      Abdominal Pain    You have been diagnosed with abdominal (belly) pain. The cause of your pain is not yet known.    Many things can cause abdominal pain. Examples include viral infections and bowel (intestine) spasms. You might need another examination or more tests to find out why you have pain.    At this time, your pain does not seem to be  caused by anything dangerous. You do not need surgery. You do not need to stay in the hospital.     Though we don't believe your condition is dangerous right now, it is important to be careful. Sometimes a problem that seems mild can become serious later. This is why it is very important that you return here or go to the nearest Emergency Department unless you are 100% improved.   Follow up with your physician.    Drink only clear liquids such as water, clear broth, sports drinks, or clear caffeine-free soft drinks, like 7-Up or Sprite, for the next:   24 hours.    YOU SHOULD SEEK MEDICAL ATTENTION IMMEDIATELY, EITHER HERE OR AT THE NEAREST EMERGENCY DEPARTMENT, IF ANY OF THE FOLLOWING OCCURS:   Your pain does not go away or gets worse.   You cannot keep fluids down or your vomit is dark green.    You vomit blood or see blood in your stool. Blood might be bright red or dark red. It can also be black and look like tar.   You have a fever (temperature higher than 100.24F / 38C) or shaking chills.   Your skin or eyes look yellow or your urine looks brown.   You have severe diarrhea.          Hypertension, Incidental    Today it was found that you have high blood pressure.     High  blood pressure may be a sign of hypertension. High blood pressure can happen one time because of pain or stress. It can also happen every day. People with high blood pressure every day have hypertension. Hypertension is diagnosed when a person has high blood pressure readings at least 3 different times. There are causes for high blood pressure that doctors can find. These include being overweight or having a kidney or hormone problem. This is called secondary hypertension. When a doctor does not know the cause, it is called "essential hypertension." Both types of hypertension may require medicine to lower the blood pressure. Some people with high blood pressure will improve if they limit the sodium (salt) in their  diets.    High blood pressure often has no symptoms. However, it can cause headaches or vision problems. In rare cases, very high blood pressure can cause seizures. It is important to diagnose and treat hypertension even if there are no symptoms. This is because high blood pressure can cause damage to organs like the heart and kidneys. This damage can be permanent (not go away).    You should have another blood pressure reading in 1 to 2 days.    See your doctor as soon as possible.     YOU SHOULD SEEK MEDICAL ATTENTION IMMEDIATELY, EITHER HERE OR AT THE NEAREST EMERGENCY DEPARTMENT IF ANY OF THE FOLLOWING OCCUR:   Your chest hurts or you have trouble breathing.   You have a severe headache or have trouble seeing.   You have seizures.   You vomit (throw up) repeatedly or get more ill.    You have been seen for high blood pressure.

## 2018-06-30 NOTE — ED Provider Notes (Signed)
EMERGENCY DEPARTMENT HISTORY AND PHYSICAL EXAM     Physician/Midlevel provider first contact with patient: 06/30/18 1256         Date: 06/30/2018  Patient Name: [name]    History of Presenting Illness     No chief complaint on file.      History Provided By: Patient, husband at bedside    Chief Complaint: abdominal pain  Duration: 1.5 weeks  Timing: waxing and waning  Location: LLQ  Quality: shooting  Severity: moderate  Exacerbating factors: standing  Alleviating factors: none   Associated Symptoms:   Pertinent Negatives: fever, n/v/d, constipation, hematuria, dysuria, vaginal bleeding, vaginal discharge, rash     Additional History: [name] is a 52 y.o. female with a hx of PCOS and partial hysterectomy presenting to the ED c/o waxing and waning shooting LLQ abdominal pain which began 1.5 weeks ago prior to going on a cruise. The pain is worse in the AM when she has a full bladder and with standing. Patient denies fever, n/v/d, constipation, hematuria, dysuria, vaginal bleeding, vaginal discharge, bowel changes, or rash. She has not had the flu shot this year.       PCP: Rudene Anda, MD  SPECIALISTS:    No current facility-administered medications for this encounter.      Current Outpatient Medications   Medication Sig Dispense Refill   . esomeprazole (NEXIUM) 20 MG capsule Take 1 capsule (20 mg total) by mouth every morning before breakfast. 60 capsule 0   . ibuprofen (ADVIL,MOTRIN) 600 MG tablet Take 1 tablet (600 mg total) by mouth every 8 (eight) hours as needed for Pain 15 tablet 0   . insulin detemir (LEVEMIR) 100 UNIT/ML injection Inject 15 Units into the skin.     Marland Kitchen insulin lispro (HUMALOG) 100 UNIT/ML injection Inject into the skin 3 (three) times daily before meals.     . Liraglutide (VICTOZA SC) Inject into the skin.     . metFORMIN (GLUCOPHAGE) 1000 MG tablet Take 500 mg by mouth 2 (two) times daily with meals.         . Metoprolol Succinate (TOPROL XL PO) Take 50 mg by mouth  daily.         . pantoprazole (PROTONIX) 40 MG tablet Take 40 mg by mouth daily.         Past History     Past Medical History:  Past Medical History:   Diagnosis Date   . Diabetes mellitus without complication    . Hypertensive disorder    . Kidney stone    . PCOS (polycystic ovarian syndrome)        Past Surgical History:  Past Surgical History:   Procedure Laterality Date   . AX PAIN CLINIC REQUEST N/A 03/11/2014    Procedure: AX PAIN CLINIC REQUEST;  Surgeon: Tally Due, MD;  Location: [facility];  Service: Anesthesiology;  Laterality: N/A;  new/ return pt   . HYSTERECTOMY  03/2009    done due to abnormal cells at time of endometrial biopsy   . LAPAROSCOPIC, GASTRIC BYPASS, CONVERSION FROM SLEEVE GASTRECTOMY     . REDUCTION MAMMAPLASTY  1991    Bilateral breast       Family History:  Family History   Problem Relation Age of Onset   . Cancer Maternal Uncle         patient is not sure what type of cancer maternal uncle has   . Lung cancer Maternal Grandfather    .  Breast cancer Neg Hx    . Ovarian cancer Neg Hx        Social History:  Social History     Tobacco Use   . Smoking status: Never Smoker   . Smokeless tobacco: Never Used   Substance Use Topics   . Alcohol use: Yes     Comment: social    . Drug use: No       Allergies:  No Known Allergies    Review of Systems     Review of Systems   Constitutional: Negative for fever.   HENT: Negative for sore throat.    Eyes: Negative for visual disturbance.   Respiratory: Negative for shortness of breath.    Cardiovascular: Negative for chest pain.   Gastrointestinal: Positive for abdominal pain. Negative for constipation, diarrhea, nausea and vomiting.   Genitourinary: Negative for dysuria, hematuria, vaginal bleeding and vaginal discharge.   Skin: Negative for rash.   Neurological: Negative for headaches.       Physical Exam   BP 160/85   Pulse 60   Temp 98 F (36.7 C) (Oral)   Resp 16   Ht 5\' 7"  (1.702 m)   Wt 113.4 kg   SpO2 100%   BMI 39.16 kg/m      Physical Exam  Vitals signs reviewed.   Constitutional:       General: She is not in acute distress.     Appearance: She is well-developed.   HENT:      Head: Normocephalic and atraumatic.   Eyes:      Pupils: Pupils are equal, round, and reactive to light.   Neck:      Musculoskeletal: Normal range of motion and neck supple.   Cardiovascular:      Rate and Rhythm: Normal rate and regular rhythm.   Pulmonary:      Effort: Pulmonary effort is normal. No respiratory distress.      Breath sounds: Normal breath sounds. No wheezing.   Abdominal:      General: There is no distension.      Palpations: Abdomen is soft.      Tenderness: There is tenderness in the left lower quadrant. There is no right CVA tenderness, left CVA tenderness, guarding or rebound. Negative signs include Murphy's sign and McBurney's sign.   Musculoskeletal: Normal range of motion.      Right lower leg: No edema.      Left lower leg: No edema.   Skin:     General: Skin is warm and dry.   Neurological:      Mental Status: She is alert and oriented to person, place, and time.          Diagnostic Study Results     Labs -     Results     Procedure Component Value Units Date/Time    Lipase [[id number]] Collected:  06/30/18 1301    Specimen:  Blood Updated:  06/30/18 1333     Lipase 28 U/L     Hemolysis index [[id number]] Collected:  06/30/18 1301     Updated:  06/30/18 1333     Hemolysis Index 7    GFR [[id number]] Collected:  06/30/18 1301     Updated:  06/30/18 1333     EGFR >60.0    Comprehensive metabolic panel [[id number]]  (Abnormal) Collected:  06/30/18 1301    Specimen:  Blood Updated:  06/30/18 1333     Glucose 170 mg/dL      BUN 7.0  mg/dL      Creatinine 0.7 mg/dL      Sodium 161 mEq/L      Potassium 3.8 mEq/L      Chloride 106 mEq/L      CO2 25 mEq/L      Calcium 9.0 mg/dL      Protein, Total 6.6 g/dL      Albumin 3.7 g/dL      AST (SGOT) 17 U/L      ALT 14 U/L      Alkaline Phosphatase 119 U/L      Bilirubin, Total 0.5 mg/dL      Globulin 2.9  g/dL      Albumin/Globulin Ratio 1.3     Anion Gap 9.0    UA, Reflex to Microscopic (pts 3 + yrs) [[id number]] Collected:  06/30/18 1301    Specimen:  Urine Updated:  06/30/18 1317     Urine Type Clean Catch     Color, UA Yellow     Clarity, UA Hazy     Specific Gravity UA 1.020     Urine pH 5.0     Leukocyte Esterase, UA Negative     Nitrite, UA Negative     Protein, UR Negative     Glucose, UA Negative     Ketones UA Negative     Urobilinogen, UA Negative mg/dL      Bilirubin, UA Negative     Blood, UA Negative    CBC with differential [[id number]]  (Abnormal) Collected:  06/30/18 1301    Specimen:  Blood Updated:  06/30/18 1315     WBC 6.82 x10 3/uL      Hgb 12.4 g/dL      Hematocrit 95.6 %      Platelets 222 x10 3/uL      RBC 4.39 x10 6/uL      MCV 91.3 fL      MCH 28.2 pg      MCHC 30.9 g/dL      RDW 12 %      MPV 10.6 fL      Neutrophils 45.7 %      Lymphocytes Automated 43.3 %      Monocytes 9.1 %      Eosinophils Automated 1.0 %      Basophils Automated 0.6 %      Immature Granulocyte 0.3 %      Nucleated RBC 0.0 /100 WBC      Neutrophils Absolute 3.12 x10 3/uL      Abs Lymph Automated 2.95 x10 3/uL      Abs Mono Automated 0.62 x10 3/uL      Abs Eos Automated 0.07 x10 3/uL      Absolute Baso Automated 0.04 x10 3/uL      Absolute Immature Granulocyte 0.02 x10 3/uL      Absolute NRBC 0.00 x10 3/uL     Urine BHCG POC [[id number]] Collected:  06/30/18 1249     Updated:  06/30/18 1256     Urine bHCG POC Negative          Radiologic Studies -   Radiology Results (24 Hour)     Procedure Component Value Units Date/Time    US Pelvic Ltd W Transvaginal [[id number]] Collected:  06/30/18 1529    Order Status:  Completed Updated:  06/30/18 1535    Narrative:        Transvaginal  pelvic ultrasound    INDICATION: pain left pelvic pain    COMPARISON: CT same day  Impression:         Hysterectomy. Neither ovary is visualized. There is bowel in the left  adnexa.                      Lorinda Creed, MD   06/30/2018 3:31 PM    CT  Abd/Pelvis with IV Contrast only [[id number]] Collected:  06/30/18 1358    Order Status:  Completed Updated:  06/30/18 1406    Narrative:       HISTORY: Left lower quadrant pain    TECHNIQUE: Enhanced, abdominal and pelvic computed tomography was  performed at 5 mm thickness.    . A combination of automatic exposure  control, adjustment of the mA a and/or KVP according to the patient's  size and or use of iterative reconstruction technique was utilized.    PRIOR: 03/16/2010.    FINDINGS:        The liver, spleen and pancreas are unremarkable.   There is no dilatation of the intra or extrahepatic bile ducts.   The gallbladder is unremarkable.     There is small size hiatal hernia. There is evidence of prior gastric  surgery. The gastrointestinal tract is within normal limits. There is no  evidence of gastrointestinal obstruction.          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 Stable since prior study. The right kidney is  unremarkable. The adrenal glands are normal.    The great vessels are normal in caliber. There is no evidence of  ascites.    Evaluation of the pelvis is unremarkable.  The urinary bladder is unremarkable.       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    Georgana Curio, MD   06/30/2018 2:02 PM      .    Medical Decision Making   I am the first provider for this patient.    I reviewed the vital signs, available nursing notes, past medical history, past surgical history, family history and social history.    Vital Signs-Reviewed the patient's vital signs.     Patient Vitals for the past 12 hrs:   BP Temp Pulse Resp   06/30/18 1541 160/85 98 F (36.7 C) 60 16   06/30/18 1227 170/79 98.4 F (36.9 C) 79 18       Pulse Oximetry Analysis - Normal 100% on RA    Old Medical Records: Nursing notes.     ED Course:     12:47 PM - Informed pt of intent to obtain imaging and lab work. She  is agreeable to the plan.    2:28 PM - Updated pt on all results thus far. She is resting comfortably after pain medication and states her pain has improved.     3:40 PM - Updated pt on final results. Repeat abdominal exam is soft and non-tender. Discussed f/u with PCP, home self care, discharge instructions, and return precautions with patient. Possibility of evolving illness reviewed. All questions solicited and addressed. Patient states understanding and amenable to discharge.     Provider Notes:   Patient with hx of partial hysterectomy p/w pain localized to the left lower abdomen. Physical exam shows LLQ abd pain w/out CVA TTP, fever, hernia, RLQ abd pain. No vaginal bleeding/discharge, pelvic pain, change in bowel habits.  Plan for labs, urine, CT A/P w/ IV contrast.  Labs w/out signs of acute pathology.  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   U/S w/out signs of acute pathology.   Pain improved after dose of toradol.     I have considered possible diagnosis for the patient's presentation and based on the history and exam and studies, I do not feel she has ectopic pregnancy/toa/pid/torsion/obstruction/chole/appy/ascending biliary illness/pancreatitis/ruptured aaa/pyelonephritis OR other sbi/surgical pathology.  The possibility of evolving illness and thus reasons to return were reviewed.  Questions solicited and answered.    Patient also found to have elevated BP, improving in ED likely 2/2 pain. No end organ damage and otherwise asymptomatic. Plan to f/u w/ PCP in 1-2 day for repeat BP check. Patient agrees and understands plan and will purchase home BP machine to also recheck at home.     On revaluation and discharge patient well appearing, with No acute distress, tolerating PO, D/W patient ED workup. d/w patient no evidence of significant other etiologies to explain patient presentation. Also discussed red flags, concerning symptoms, reviewed  possibility of progression of disease and diagnostic uncertainty.  Reviewed return precautions in detail. Will D/c patient w/ close f/u w/ PMD, urology or ED if patient unable to have a follow up appointment. Will return with any worsening symptoms or any other concerns for further work up. All questions were answered, and the patient is comfortable with the disposition. Patient agreed and understood plan.     Diagnosis     Clinical Impression:   1. Left sided abdominal pain    2. Hydronephrosis, unspecified hydronephrosis type    3. Hydroureter    4. Elevated blood pressure reading        Treatment Plan:   ED Disposition     ED Disposition Condition Date/Time Comment    Discharge  Mon Jun 30, 2018  3:52 PM Landry Dyke discharge to home/self care.    Condition at disposition: Stable            _______________________________      Attestations: This note is prepared by Vincent Gros, acting as scribe for Reginia Forts, MD.    Reginia Forts, MD - The scribe's documentation has been prepared under my direction and personally reviewed by me in its entirety.  I confirm that the note above accurately reflects all work, treatment, procedures, and medical decision making performed by me.    _______________________________     Marko Stai, MD  07/02/18 [phone]

## 2018-06-30 NOTE — ED Triage Notes (Signed)
[name] is a 52 y.o. female from home, accompanied by husband, c/o LLQ pain radiates to lower back started about 4 days ago states pain is worse in the morning.  Denies blood in urine,. Vaginal bleeding , vaginal discharge, nausea, vomiting and diarrhea.

## 2020-12-20 NOTE — Progress Notes (Signed)
[facility]  [address] [place], [place] [address]     Nelva Bush    Date of Visit: 09/12/2016   Date of Birth: 26-May-1966  Age: 55 yrs.  Medical  Record Number: [id number]  __  CURRENT DIAGNOSES     1. Shortness of breath, R06.02  2. Abnormal  electrocardiogram, R94.31  3. Pre-op cardiovascular examination, Z01.810  __  ALLERGIES     No Known Drug Allergies  __  MEDICATIONS     1. Toprol XL 50 mg tablet,extended release, 1 po  qd  2. metformin 500 mg tablet, 1 po bid  3. insulin lispro 100 unit/mL subcutaneous pen, Take as Directed  4. Victoza 2-Pak 0.6 mg/0.1 mL (18 mg/3 mL) subcutaneous pen injector, Take as Directed  5. pantoprazole 40 mg tablet,delayed release,  1 po qd    __  HISTORY OF PRESENT ILLNESS  [name] is a 25 year old woman last seen by my colleague, Dr. Brett Canales Day. She was seen as part of  a preop evaluation prior to gastric sleeve surgery. She had an echocardiogram which showed grade 1 diastolic dysfunction but which was otherwise normal and a normal nuclear study in May 2017. She had a gastric sleeve surgery last month. Initially she  did well. Lately she has been having shortness of breath with decreasing levels of activity. She saw her primary care physician who then sent her to the emergency room. In the emergency room, she had an unremarkable ECG, labs, and a CT scan which was  negative for pulmonary emboli. It sounds like she had a CT scan a few weeks ago which was similarly negative for pulmonary emboli. She denies symptoms at rest. She does feel like she is having a lot of reflux with a sour taste and acid feeling in her  mouth.   _____  PAST HISTORY     Past Medical Illnesses : Diabetes mellitus-non-insulin dependent, Hyperlipidemia, Hypertension;  Past Cardiac Illnesses: No  previous history of cardiac disease.; Infectious Diseases: No history of infectious diseases; Surgical  Procedures: Breast Augmentation, Hysterectomy-subtotal; Trauma History: Fracture arm,  Fracture foot, back  injury; Cardiology Procedures-Noninvasive: Echocardiogram May 2017, MPI Single Isotope Exercise May 2017;  Left Ventricular Ejection Fraction: LVEF of 50% documented via nuclear study on 01/30/2016  ___   FAMILY HISTORY  Father -- Diabetes mellitus  Mother  -- Hypertension    __  CARDIAC RISK FACTORS      Tobacco Abuse: has never used tobacco; Family History of Heart Disease: negative;  Hyperlipidemia: positive; Hypertension: negative;   Diabetes Mellitus: positive; Obesity: positive, BMI30 (Obesity);  [id number]  __  SOCIAL HISTORY    Alcohol  Use: wine and mixed drinks; Smoking: Does not smoke; Never smoker ([id number]);  Diet: Regular diet and Caffeine use-1-2 per day; Exercise: Exercises occasionally;   __   PHYSICAL EXAMINATION    Vital Signs:  Blood Pressure:   110/80 Sitting, Left arm, regular cuff  110/80 Sitting, Right arm, regular cuff    Weight: 219.00 lbs.   Height: 67"  BMI: 34   Pulse:  91/min.   Respirations: 14/min.       Constitutional:  Cooperative, alert and oriented,well developed, well nourished, in no acute distress. Skin: Warm and dry to touch, no apparent skin lesions, or masses  noted. Head: Normocephalic, normal hair pattern, no masses or tenderness  Eyes: EOMS Intact, PERRL, conjunctivae and lids normal. Funduscopic exam and visual fields not performed. ENT : Ears, Nose and throat reveal no  gross abnormalities. No pallor or cyanosis. Dentition good. Neck:  No palpable masses or adenopathy, no thyromegaly, no JVD, carotid pulses are full and equal bilaterally without bruits. Chest: Normal symmetry, no tenderness  to palpation, normal respiratory excursion, no intercostal retraction, no use of accessory muscles, normal diaphragmatic excursion, clear to auscultation and percussion. Cardiac : Regular rhythm, S1 normal, S2 normal, No S3 or S4, Apical impulse not displaced, no murmurs, gallops or rubs detected. Abdomen: Abdomen soft, bowel sounds  normoactive, no masses, no  hepatosplenomegaly, non-tender, no bruits Peripheral Pulses: The femoral,  popliteal, dorsalis pedis, and posterior tibial pulses are full and equal bilaterally with no bruits auscultated. Extremities/Back: No deformities, clubbing,  cyanosis, erythema or edema observed. There are no spinal abnormalities noted. Normal muscle strength and tone. Neurological:  No gross motor or sensory deficits noted, affect appropriate, oriented to time, person and place.   __    Medications added today by the physician:     ECG:  ECG is sinus rhythm, nonspecific ST changes, similar to prior.    IMPRESSIONS:  1. Worsening  dyspnea on exertion.  2. Recent gastric sleeve surgery.  3. Unremarkable echocardiogram and nuclear study 01/2016.  4. CT scan negative for cardiothoracic abnormalities or pulmonary emboli.     PLAN:  Stress echocardiogram. If that is unremarkable, it could be that she is having some sort of bronchospasm related to acid reflux. If abnormal, plan accordingly.     Followup with myself or  Dr. Morrie Sheldon in one month.    Rogelio Seen, MD, [facility]     [id number]    cc: Rudene Anda MD    SJ  ____________________________   TODAYS ORDERS  Treadmill Stress Echo First Available  Return Visit 15 MIN 1 month  Diet mgmt edu, guidance and counseling TODAY  12 Lead ECG Today

## 2020-12-20 NOTE — Progress Notes (Signed)
[facility]  [address] [place], [place] [address]     [name]    Date of Visit: 02/14/2016   Date of Birth: Jun 11, 1966  Age: 55 yrs.  Medical  Record Number: [id number]    [name] presents for scheduled follow up.    To review, she is a 55 y.o. F with history of DM and HTN. She is now planning weight loss surgery. She had a preop EKG  which showed delayed R wave progression, possible prior anterior MI. She has no history of chest pain. She walks for exercise. She notes high heart rate and dyspnea with exertion, nothing severe and nothing progressive. She has rare LE edema. She has  a prior back injury/slipped disk in her back. She does not smoke. She has no FHX premature CAD. At our first visit I arranged an echo (diastolic dysfunction only) and a thallium (possible small/mild lateral ischemia only).     Since last visit  she continues to do well. She has no chest pain. BP is upper limits of normal on medications. She is working towards her weight loss surgery in late June or early July (a sleep study is being arranged).     EKG last visit: NSR, delayed R wave progression.     __  PAST HISTORY      Past Medical Illnesses: Diabetes mellitus-non-insulin dependent, Hyperlipidemia, Hypertension;  Past  Cardiac Illnesses: No previous history of cardiac disease.; Infectious Diseases: No history of infectious  diseases; Surgical Procedures: Breast Augmentation, Hysterectomy-subtotal; Trauma History : Fracture arm, Fracture foot, back injury    ALLERGIES    No Known Drug Allergies   __  MEDICATIONS     1. Toprol XL 50 mg tablet,extended release, 1 po qd  2. metformin 500 mg tablet, 1 po bid  3. insulin lispro 100 unit/mL  subcutaneous pen, Take as Directed  4. Victoza 2-Pak 0.6 mg/0.1 mL (18 mg/3 mL) subcutaneous pen injector, Take as Directed  ___  FAMILY HISTORY   Father -- Diabetes mellitus  Mother -- Hypertension    SOCIAL HISTORY     Alcohol Use: wine and mixed drinks; Smoking: Does not  smoke; Never smoker ([id number]);  Diet: Regular diet and Caffeine use-1-2 per [name]; Exercise: Exercises occasionally;   __   REVIEW OF SYSTEMS    General: obesity, fatigue;  Integumentary: Denies any change in hair or nails, rashes, or skin lesions.; Eyes: wears eye glasses/contact  lenses; Ears, Nose, Throat, Mouth: Denies any hearing loss, epistaxis, hoarseness or difficulty speaking.; Respiratory: dyspnea with exertion; Cardiovascular: Please review HPI;  Abdominal : Denies ulcer disease, hematochezia or melena.;Musculoskeletal:Denies any venous insufficiency,  arthritic symptoms or back problems.; Neurological : Denies any recurrent strokes, TIA, or seizure disorder.;  Psychiatric: anxiety, depression; Endocrine: Denies any weight change, heat/cold intolerance, polydipsia,  or polyuria; Hematologic/Immunologic: Denies any food allergies, seasonal allergies, bleeding disorders.  __   PHYSICAL EXAMINATION    150/90 Sitting, Left arm, large cuff  150/90 Sitting, Right arm, large cuff    Weight:  253.00 lbs.  Height: 67"  BMI: 39    Pulse: 80/min.   Respirations: 16/min.    Constitutional: Cooperative, alert and oriented,well developed, well nourished, in no acute distress. Skin:  Warm and dry to touch, no apparent skin lesions, or masses noted. Head: Normocephalic, normal hair pattern, no masses or tenderness  Eyes: EOMS Intact, PERRL, conjunctivae and lids normal. Funduscopic exam and visual fields not performed. ENT : Ears, Nose and throat reveal  no gross abnormalities. No pallor or cyanosis. Dentition good. Neck: No palpable masses or adenopathy, no thyromegaly,  no JVD, carotid pulses are full and equal bilaterally without bruits. Chest: Normal symmetry, no tenderness to palpation, normal respiratory excursion,  no intercostal retraction, no use of accessory muscles, normal diaphragmatic excursion, clear to auscultation and percussion. Cardiac: Regular rhythm,  S1 normal, S2 normal, No S3 or S4, Apical impulse  not displaced, no murmurs, gallops or rubs detected. Abdomen: Abdomen soft, bowel sounds normoactive,  no masses, no hepatosplenomegaly, non-tender, no bruits Peripheral Pulses: The femoral, popliteal, dorsalis pedis, and posterior tibial pulses are full  and equal bilaterally with no bruits auscultated. Extremities/Back: No deformities, clubbing, cyanosis, erythema or edema observed. There are no spinal  abnormalities noted. Normal muscle strength and tone. Neurological: No gross motor or sensory deficits noted, affect appropriate, oriented to time, person  and place.   __    Medications added today by the physician:    IMPRESSIONS/RECOMMENDATIONS:    1. Abnormal (preop) EKG. Delayed  R wave progression. Possible prior (silent) anterior MI versus normal variant. Thallium shows no anterior defect. Possible small/mild lateral ischemia only (low risk defect). No angina. Normal EF.   2. DM. On metformin/insulin.   3. HTN. Borderline  control.   4. Anticipating weight loss surgery in June.    Given no angina, normal LVEF, and low risk thallium, would consider her low risk for planned weight loss surgery without further testing. I will plan to see her back here in a year.  I will likely repeat the thallium (mild abnormality) in two years to rule out progression. Will follow.       cc:   Rudene Anda MD       Karle Starch, MD, [facility]

## 2020-12-20 NOTE — H&P (Signed)
[facility]  [address] [place], [place] [address]     [name]    Date of Visit: 01/16/2016   Date of Birth: February 19, 1966  Age: 55 yrs.  Medical  Record Number: [id number]  Referring Physician: CORELLA MD, AUGUSTO C.  __   CURRENT DIAGNOSES     1. Abnormal electrocardiogram, R94.31  __  ALLERGIES     No Known Drug Allergies  __  MEDICATIONS     1. Toprol XL 50 mg tablet,extended release, 1 po  qd  2. metformin 500 mg tablet, 1 po bid  3. insulin lispro 100 unit/mL subcutaneous pen, Take as Directed  4. Victoza 2-Pak 0.6 mg/0.1 mL (18 mg/3 mL) subcutaneous pen injector, Take as Directed  __   CHIEF COMPLAINT/REASON FOR VISIT  Abnormal ECG  __  HISTORY OF PRESENT ILLNESS  [name] presents  as a new patient to me. She is a 55 y.o. F with history of DM and HTN. She is now planning weight loss surgery. She had a preop EKG which showed delayed R wave progression, possible prior anterior MI. She has no history of chest pain. She walks for exercise.  She notes high heart rate and dyspnea with exertion, nothing severe and nothing progressive. She has rare LE edema. She has a prior back injury/slipped disk in her back and may or may not be able to walk the treadmill to [place]. She does not smoke. She  has no FHX premature CAD.     EKG: NSR, delayed R wave progression.    __   PAST HISTORY     Past Medical Illnesses: Diabetes mellitus-non-insulin dependent, Hyperlipidemia,  Hypertension;  Past Cardiac Illnesses: No previous history of cardiac disease.;  Infectious Diseases: No history of infectious diseases; Surgical Procedures: Breast Augmentation, Hysterectomy-subtotal;  Trauma History: Fracture arm, Fracture foot, back injury  ___  FAMILY HISTORY   Father -- Diabetes mellitus  Mother -- Hypertension    SOCIAL HISTORY     Alcohol Use: wine and mixed drinks; Smoking: Does not smoke; Never smoker ([id number]);  Diet: Regular diet and Caffeine use-1-2 per Sixto Bowdish; Exercise: Exercises occasionally;   __    REVIEW OF SYSTEMS    General: obesity, fatigue;  Integumentary: Denies any change in hair or nails, rashes, or skin lesions.; Eyes: wears eye glasses/contact  lenses; Ears, Nose, Throat, Mouth: Denies any hearing loss, epistaxis, hoarseness or difficulty speaking.; Respiratory: dyspnea with exertion; Cardiovascular: Please review HPI;  Abdominal : Denies ulcer disease, hematochezia or melena.;Musculoskeletal:Denies any venous insufficiency,  arthritic symptoms or back problems.; Neurological : Denies any recurrent strokes, TIA, or seizure disorder.;  Psychiatric: anxiety, depression; Endocrine: Denies any weight change, heat/cold intolerance, polydipsia,  or polyuria; Hematologic/Immunologic: Denies any food allergies, seasonal allergies, bleeding disorders.  __   PHYSICAL EXAMINATION    Vital Signs:  Blood Pressure:  140/90 Sitting, Left arm, regular  cuff  134/90 Sitting, Right arm, regular cuff    Weight: 250.00 lbs.  Height:  67"  BMI: 39   Pulse: 93/min.    Respirations: 14/min.       Constitutional: Cooperative, alert and oriented,well developed,  well nourished, in no acute distress. Skin: Warm and dry to touch, no apparent skin lesions, or masses noted.  Head: Normocephalic, normal hair pattern, no masses or tenderness Eyes: EOMS Intact, PERRL, conjunctivae  and lids normal. Funduscopic exam and visual fields not performed. ENT: Ears, Nose and throat reveal no gross abnormalities. No pallor or cyanosis. Dentition  good.  Neck: No palpable masses or adenopathy, no thyromegaly, no JVD, carotid pulses are full and equal bilaterally without bruits.  Chest: Normal symmetry, no tenderness to palpation, normal respiratory excursion, no intercostal retraction, no use of accessory muscles, normal diaphragmatic excursion, clear to auscultation and percussion.  Cardiac: Regular rhythm, S1 normal, S2 normal, No S3 or S4, Apical impulse not displaced, no murmurs, gallops or rubs detected. Abdomen : Abdomen soft, bowel  sounds normoactive, no masses, no hepatosplenomegaly, non-tender, no bruits Peripheral Pulses: The femoral, popliteal, dorsalis  pedis, and posterior tibial pulses are full and equal bilaterally with no bruits auscultated. Extremities/Back: No deformities, clubbing, cyanosis, erythema  or edema observed. There are no spinal abnormalities noted. Normal muscle strength and tone. Neurological: No gross motor or sensory deficits noted,  affect appropriate, oriented to time, person and place.   __    Medications added today by the physician:    IMPRESSIONS/RECOMMENDATIONS:     1. Abnormal (preop) EKG. Delayed R wave progression. Possible prior (silent) anterior MI versus normal variant.  2. DM.  3. HTN.  4. Anticipating weight loss surgery in June.    I will arrange an echo. Rule out LVH, wall motion abnormality,  cardiomyopathy.  Stress thallium. Switch to lexiscan if unable to tolerate treadmill (back). Rule out CAD/ischemia. Risks for CAD.  I will see her back after both studies to review (before her planned surgery).      cc: Rudene Anda MD      Karle Starch, MD, [facility]

## 2022-01-06 ENCOUNTER — Encounter (HOSPITAL_BASED_OUTPATIENT_CLINIC_OR_DEPARTMENT_OTHER): Payer: Self-pay | Admitting: Emergency Medicine

## 2022-01-06 ENCOUNTER — Other Ambulatory Visit: Payer: Self-pay

## 2022-01-06 ENCOUNTER — Emergency Department (HOSPITAL_BASED_OUTPATIENT_CLINIC_OR_DEPARTMENT_OTHER): Payer: Federal, State, Local not specified - PPO

## 2022-01-06 ENCOUNTER — Emergency Department (HOSPITAL_BASED_OUTPATIENT_CLINIC_OR_DEPARTMENT_OTHER)
Admission: EM | Admit: 2022-01-06 | Discharge: 2022-01-06 | Disposition: A | Discharge status: Home / Self Care | Payer: Federal, State, Local not specified - PPO | Attending: Emergency Medicine | Admitting: Emergency Medicine

## 2022-01-06 DIAGNOSIS — T83098A Other mechanical complication of other indwelling urethral catheter, initial encounter: Secondary | ICD-10-CM | POA: Diagnosis not present

## 2022-01-06 DIAGNOSIS — E119 Type 2 diabetes mellitus without complications: Secondary | ICD-10-CM | POA: Diagnosis not present

## 2022-01-06 DIAGNOSIS — I10 Essential (primary) hypertension: Secondary | ICD-10-CM | POA: Diagnosis not present

## 2022-01-06 DIAGNOSIS — N132 Hydronephrosis with renal and ureteral calculous obstruction: Secondary | ICD-10-CM | POA: Insufficient documentation

## 2022-01-06 DIAGNOSIS — N200 Calculus of kidney: Secondary | ICD-10-CM

## 2022-01-06 DIAGNOSIS — Z978 Presence of other specified devices: Secondary | ICD-10-CM

## 2022-01-06 DIAGNOSIS — R1032 Left lower quadrant pain: Secondary | ICD-10-CM | POA: Diagnosis present

## 2022-01-06 HISTORY — DX: Type 2 diabetes mellitus without complications: E11.9

## 2022-01-06 HISTORY — DX: Essential (primary) hypertension: I10

## 2022-01-06 LAB — CBC WITH DIFFERENTIAL/PLATELET
Abs Immature Granulocytes: 0.02 10*3/uL (ref 0.00–0.07)
Basophils Absolute: 0 10*3/uL (ref 0.0–0.1)
Basophils Relative: 0 %
Eosinophils Absolute: 0.1 10*3/uL (ref 0.0–0.5)
Eosinophils Relative: 1 %
HCT: 40.9 % (ref 36.0–46.0)
Hemoglobin: 13.4 g/dL (ref 12.0–15.0)
Immature Granulocytes: 0 %
Lymphocytes Relative: 23 %
Lymphs Abs: 2.3 10*3/uL (ref 0.7–4.0)
MCH: 29.8 pg (ref 26.0–34.0)
MCHC: 32.8 g/dL (ref 30.0–36.0)
MCV: 90.9 fL (ref 80.0–100.0)
Monocytes Absolute: 1 10*3/uL (ref 0.1–1.0)
Monocytes Relative: 10 %
Neutro Abs: 6.7 10*3/uL (ref 1.7–7.7)
Neutrophils Relative %: 66 %
Platelets: 209 10*3/uL (ref 150–400)
RBC: 4.5 MIL/uL (ref 3.87–5.11)
RDW: 12.4 % (ref 11.5–15.5)
WBC: 10.2 10*3/uL (ref 4.0–10.5)
nRBC: 0 % (ref 0.0–0.2)

## 2022-01-06 LAB — COMPREHENSIVE METABOLIC PANEL
ALT: 22 U/L (ref 0–44)
AST: 32 U/L (ref 15–41)
Albumin: 3.7 g/dL (ref 3.5–5.0)
Alkaline Phosphatase: 102 U/L (ref 38–126)
Anion gap: 8 (ref 5–15)
BUN: 9 mg/dL (ref 6–20)
CO2: 25 mmol/L (ref 22–32)
Calcium: 8.5 mg/dL — ABNORMAL LOW (ref 8.9–10.3)
Chloride: 106 mmol/L (ref 98–111)
Creatinine, Ser: 0.95 mg/dL (ref 0.44–1.00)
GFR, Estimated: >60 mL/min (ref >60)
Glucose, Bld: 195 mg/dL — ABNORMAL HIGH (ref 70–99)
Potassium: 3.5 mmol/L (ref 3.5–5.1)
Sodium: 139 mmol/L (ref 135–145)
Total Bilirubin: 0.3 mg/dL (ref 0.3–1.2)
Total Protein: 7 g/dL (ref 6.5–8.1)

## 2022-01-06 LAB — URINALYSIS, ROUTINE W REFLEX MICROSCOPIC
Bilirubin Urine: NEGATIVE
Glucose, UA: NEGATIVE mg/dL
Ketones, ur: NEGATIVE mg/dL
Nitrite: NEGATIVE
Protein, ur: NEGATIVE mg/dL
Specific Gravity, Urine: >=1.03 (ref 1.005–1.030)
pH: 5.5 (ref 5.0–8.0)

## 2022-01-06 LAB — URINALYSIS, MICROSCOPIC (REFLEX): WBC, UA: NONE SEEN WBC/hpf (ref 0–5)

## 2022-01-06 LAB — LIPASE, BLOOD: Lipase: 34 U/L (ref 11–51)

## 2022-01-06 MED ORDER — TAMSULOSIN HCL 0.4 MG PO CAPS: 0.4000 mg | ORAL_CAPSULE | Freq: Every day | ORAL | 0 refills | Status: DC

## 2022-01-06 MED ORDER — HYDROCODONE-ACETAMINOPHEN 5-325 MG PO TABS: 2.0000 | ORAL_TABLET | ORAL | 0 refills | Status: DC | PRN

## 2022-01-06 MED ORDER — TAMSULOSIN HCL 0.4 MG PO CAPS: 0.4000 mg | ORAL_CAPSULE | Freq: Every day | ORAL | 0 refills | Status: AC

## 2022-01-06 MED ORDER — ONDANSETRON 8 MG PO TBDP: 8.0000 mg | ORAL_TABLET | Freq: Three times a day (TID) | ORAL | 0 refills | Status: AC | PRN

## 2022-01-06 MED ORDER — HYDROCODONE-ACETAMINOPHEN 5-325 MG PO TABS: 2.0000 | ORAL_TABLET | ORAL | 0 refills | Status: AC | PRN

## 2022-01-06 MED ORDER — ONDANSETRON 8 MG PO TBDP: 8.0000 mg | ORAL_TABLET | Freq: Three times a day (TID) | ORAL | 0 refills | Status: DC | PRN

## 2022-01-06 MED ORDER — KETOROLAC TROMETHAMINE 30 MG/ML IJ SOLN
30.0000 mg | Freq: Once | INTRAMUSCULAR | Status: AC
  Administered 2022-01-06: 30 mg via INTRAVENOUS
  Filled 2022-01-06: qty 1

## 2022-01-06 MED ORDER — FENTANYL CITRATE PF 50 MCG/ML IJ SOSY
50.0000 ug | PREFILLED_SYRINGE | Freq: Once | INTRAMUSCULAR | Status: AC
  Administered 2022-01-06: 50 ug via INTRAVENOUS
  Filled 2022-01-06: qty 1

## 2022-01-06 MED ORDER — TAMSULOSIN HCL 0.4 MG PO CAPS
0.4000 mg | ORAL_CAPSULE | Freq: Every day | ORAL | Status: DC
  Administered 2022-01-06: 0.4 mg via ORAL
  Filled 2022-01-06: qty 1

## 2022-01-06 NOTE — Discharge Instructions (Addendum)
Take the Flomax once daily at dinner. ?Try take ibuprofen for the pain, take the narcotic for breakthrough pain.  Take it with Zofran to prevent nausea and vomiting. ?Call your urologist Monday morning to schedule an appointment for the middle of the week.  You will need to have the catheter taken out at that point. ? ?

## 2022-01-06 NOTE — ED Triage Notes (Addendum)
LLQ pain starting today with progressive worsening throughout day. Pain is constant and radiates in flank. Nauseated and vomited once. Denies diarrhea. Hx of kidney stones but it has been awhile.  ?Has not urinated since 0600 but does feel pressure and need.  ?

## 2022-01-06 NOTE — ED Provider Notes (Signed)
?[facility] ?Provider Note ? ? ?CSN: [id number] ?Arrival date & time: 01/06/22  [id number] ? ?  ? ?History ? ?Chief Complaint  ?Patient presents with  ? Abdominal Pain  ? ? ?Kerry Watts is a 56 y.o. female. ? ? ?Abdominal Pain ? ?Patient with medical history notable for hypertension and diabetes presents today with left lower quadrant abdominal pain.  Started acutely this morning, the pain has been constant.  It radiates to her left flank.  It is associated with urinary retention, she states she has not been able to urinate since this morning.  Denies any preceding hematuria, she does have a history of prior kidney stones requiring stenting.  Denies any nausea or vomiting.  Denies any pelvic pain, no dysuria or vaginal discharge. ? ?Status post abdominal hysterectomy and gastrectomy. ? ?Home Medications ?Prior to Admission medications   ?Medication Sig Start Date End Date Taking? Authorizing Provider  ?HYDROcodone-acetaminophen (NORCO/VICODIN) 5-325 MG tablet Take 2 tablets by mouth every 4 (four) hours as needed. 01/06/22  Yes Sherrill Raring, PA-C  ?ondansetron (ZOFRAN-ODT) 8 MG disintegrating tablet Take 1 tablet (8 mg total) by mouth every 8 (eight) hours as needed. 01/06/22  Yes Sherrill Raring, PA-C  ?tamsulosin (FLOMAX) 0.4 MG CAPS capsule Take 1 capsule (0.4 mg total) by mouth daily after supper. 01/06/22  Yes Sherrill Raring, PA-C  ?   ? ?Allergies    ?Patient has no known allergies.   ? ?Review of Systems   ?Review of Systems  ?Gastrointestinal:  Positive for abdominal pain.  ? ?Physical Exam ?Updated Vital Signs ?BP (!) 149/90 (BP Location: Right Arm)   Pulse 79   Temp 98.3 ?F (36.8 ?C) (Oral)   Resp 18   SpO2 100%  ?Physical Exam ?Vitals and nursing note reviewed. Exam conducted with a chaperone present.  ?Constitutional:   ?   Appearance: Normal appearance.  ?   Comments: Uncomfortable appearing  ?HENT:  ?   Head: Normocephalic and atraumatic.  ?Eyes:  ?   General: No scleral icterus.    ?    Right eye: No discharge.     ?   Left eye: No discharge.  ?   Extraocular Movements: Extraocular movements intact.  ?   Pupils: Pupils are equal, round, and reactive to light.  ?Cardiovascular:  ?   Rate and Rhythm: Normal rate and regular rhythm.  ?   Pulses: Normal pulses.  ?   Heart sounds: Normal heart sounds. No murmur heard. ?  No friction rub. No gallop.  ?Pulmonary:  ?   Effort: Pulmonary effort is normal. No respiratory distress.  ?   Breath sounds: Normal breath sounds.  ?Abdominal:  ?   General: Abdomen is flat. A surgical scar is present. Bowel sounds are normal. There is no distension.  ?   Palpations: Abdomen is soft.  ?   Tenderness: There is abdominal tenderness in the left lower quadrant. There is left CVA tenderness.  ?Skin: ?   General: Skin is warm and dry.  ?   Coloration: Skin is not jaundiced.  ?Neurological:  ?   Mental Status: She is alert. Mental status is at baseline.  ?   Coordination: Coordination normal.  ? ? ?ED Results / Procedures / Treatments   ?Labs ?(all labs ordered are listed, but only abnormal results are displayed) ?Labs Reviewed  ?COMPREHENSIVE METABOLIC PANEL - Abnormal; Notable for the following components:  ?    Result Value  ? Glucose, Bld 195 (*)   ?  Calcium 8.5 (*)   ? All other components within normal limits  ?URINALYSIS, ROUTINE W REFLEX MICROSCOPIC - Abnormal; Notable for the following components:  ? Hgb urine dipstick MODERATE (*)   ? Leukocytes,Ua SMALL (*)   ? All other components within normal limits  ?URINALYSIS, MICROSCOPIC (REFLEX) - Abnormal; Notable for the following components:  ? Bacteria, UA RARE (*)   ? All other components within normal limits  ?CBC WITH DIFFERENTIAL/PLATELET  ?LIPASE, BLOOD  ? ? ?EKG ?None ? ?Radiology ?CT Renal Stone Study ? ?Result Date: 01/06/2022 ?CLINICAL DATA:  Flank pain, kidney stone suspected. EXAM: CT ABDOMEN AND PELVIS WITHOUT CONTRAST TECHNIQUE: Multidetector CT imaging of the abdomen and pelvis was performed following the  standard protocol without IV contrast. RADIATION DOSE REDUCTION: This exam was performed according to the departmental dose-optimization program which includes automated exposure control, adjustment of the mA and/or kV according to patient size and/or use of iterative reconstruction technique. COMPARISON:  None. FINDINGS: Lower chest: No acute abnormality. Hepatobiliary: No focal liver abnormality is seen. No gallstones, gallbladder wall thickening, or biliary dilatation. Pancreas: Moderate pancreatic atrophy. No pancreatic ductal dilatation or surrounding inflammatory changes. Spleen: Normal in size without focal abnormality. Adrenals/Urinary Tract: Adrenal glands are unremarkable. Severe left hydroureteronephrosis secondary to a 5 x 4 mm calculus in the distal left ureter near the ureterovesical junction. Dilated distal left ureter measures up to 1.9 cm. Marked left perinephric fat stranding. Punctate calculus in the upper pole of the left kidney and another 1 cm calculus/sludge in lower pole of the left kidney. Right kidney and ureter are normal. Bladder is unremarkable. Stomach/Bowel: Postsurgical changes for prior sleeve gastrectomy. Appendix appears normal. No evidence of bowel wall thickening, distention, or inflammatory changes. Vascular/Lymphatic: No significant vascular findings are present. No enlarged abdominal or pelvic lymph nodes. Reproductive: Status post hysterectomy. No adnexal masses. Other: No abdominal wall hernia or abnormality. No abdominopelvic ascites. Musculoskeletal: Moderate multilevel degenerative disc disease of the lumbar spine. No acute osseous abnormality. IMPRESSION: 1. Marked left hydroureteronephrosis secondary to a 5 x 4 mm calculus in the distal left ureter. The distal ureter is markedly dilated measuring up to 1.9 cm. Marked left perinephric fat stranding. 2. Nonobstructing calculi in the upper and lower pole of the left kidney, the lower pole calculus measuring up to 1 cm. 3.   Right kidney and ureter are unremarkable. 4.  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 Electronically Signed   By: Keane Police D.O.   On: 01/06/2022 20:57   ? ?Procedures ?Procedures  ? ? ?Medications Ordered in ED ?Medications  ?ketorolac (TORADOL) 30 MG/ML injection 30 mg (has no administration in time range)  ?tamsulosin (FLOMAX) capsule 0.4 mg (has no administration in time range)  ?fentaNYL (SUBLIMAZE) injection 50 mcg (50 mcg Intravenous Given 01/06/22 2028)  ? ? ?ED Course/ Medical Decision Making/ A&P ?  ?                        ?Medical Decision Making ?Amount and/or Complexity of Data Reviewed ?Labs: ordered. ?Radiology: ordered. ? ?Risk ?Prescription drug management. ? ? ?This patient presents to the ED for concern of left flank pain and left lower quadrant abdominal pain, this involves an extensive number of treatment options, and is a complaint that carries with it a high risk of complications and morbidity.  The differential diagnosis includes Nephrolithiasis, diverticulitis, urinary obstruction, pyelonephritis, UTI ? ? ? ?Additional history obtained:  ? ?Independent historian: family  member ? ?  ?Lab Tests: ? ?I ordered, viewed, and personally interpreted labs.  The pertinent results include:   ?CBC does not show any leukocytosis or anemia.  CMP without gross electrolyte derangement, no AKI. ?UA without any signs of infection, moderate hematuria consistent with nephrolithiasis. ? ? ?  ?Imaging Studies ordered: ? ?I directly visualized the CT renal, which showed nephrolithiasis and marked hydroureteronephrosis ? ?I agree with the radiologist interpretation ?  ? ?ECG/Cardiac monitoring:  ? ? ?The patient was maintained on a cardiac monitor.  Visualized monitor strip which showed NSR per my interpretation.  ? ? ?Medicines ordered and prescription drug management: ? ?I ordered medication including: Fentanyl, Flomax, Toradol ? ?I have reviewed the patients home medicines and have made  adjustments as needed ? ?  ?Consultations Obtained: ? ?I requested consultation with the Dr. Alyson Ingles with urology.  Discussed lab and imaging findings as well as pertinent plan - they recommend: Flomax, Fole

## 2022-01-06 NOTE — ED Notes (Signed)
Bladder scan 0ml

## 2022-01-18 ENCOUNTER — Telehealth (HOSPITAL_BASED_OUTPATIENT_CLINIC_OR_DEPARTMENT_OTHER): Payer: Self-pay | Admitting: Emergency Medicine

## 2023-04-12 IMAGING — CT CT RENAL STONE PROTOCOL
2 of 4 series · 16 of 46 positions shown, 18 images · non-contrast
Comparison: None.

CLINICAL DATA: Flank pain, kidney stone suspected.



[Series 2: axial st · axial · 0.88mm/px · z∈[+177,+612]mm · 13 of 95 slices shown, 15 images]
[im 4/95  soft-tissue]
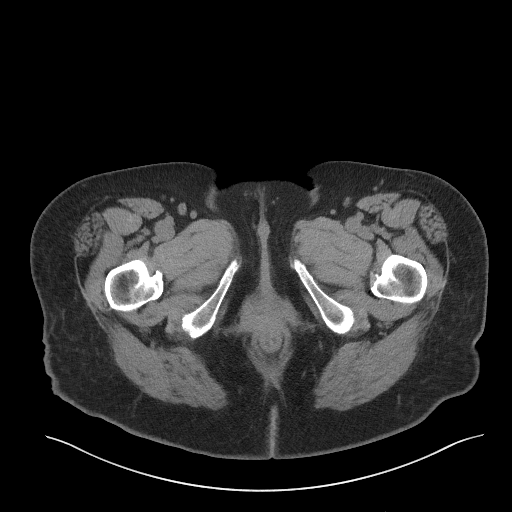
[im 4/95  bone]
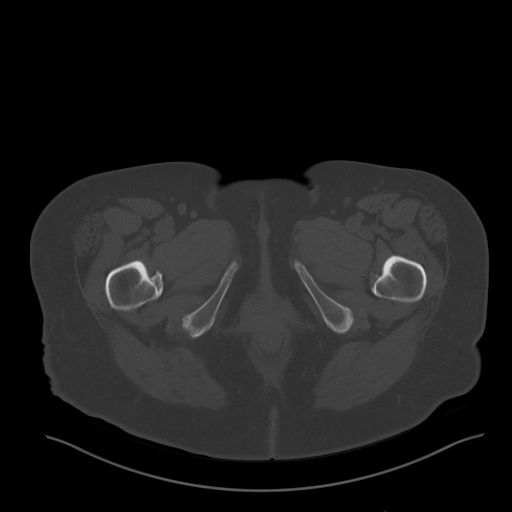
[im 11/95  soft-tissue]
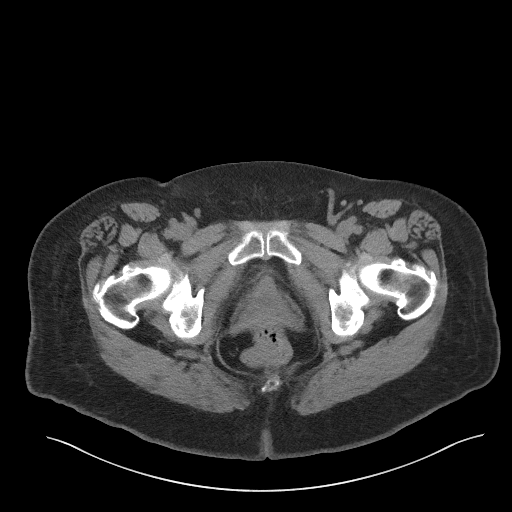
[im 19/95  soft-tissue]
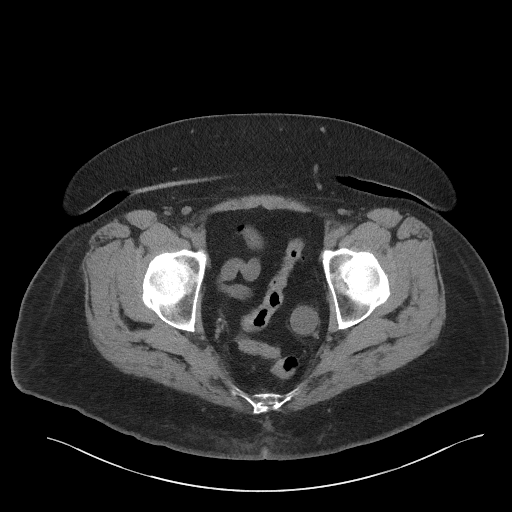
[im 26/95  soft-tissue]
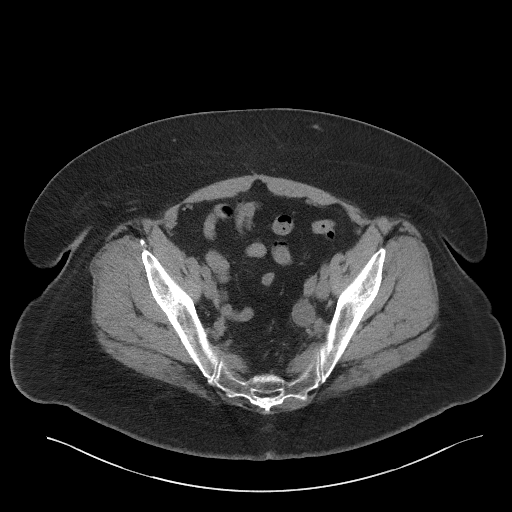
[im 33/95  soft-tissue]
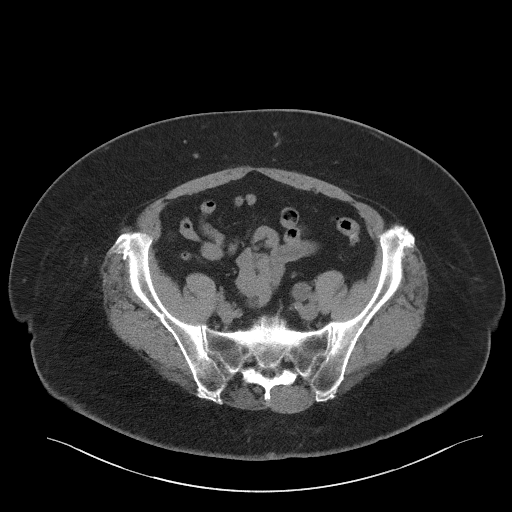
[im 40/95  soft-tissue]
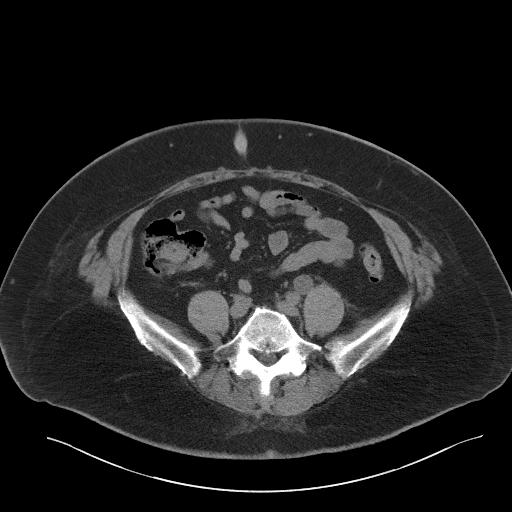
[im 48/95  soft-tissue]
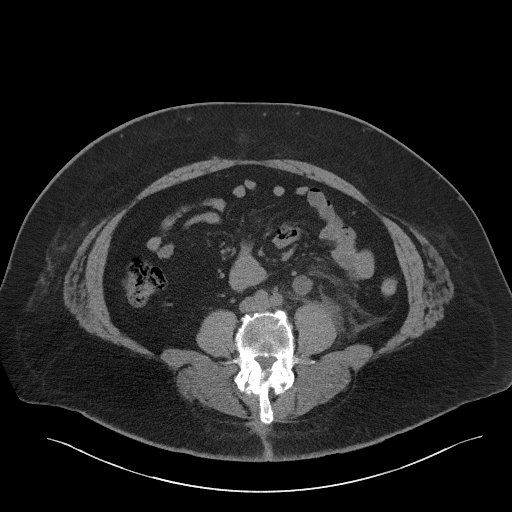
[im 55/95  soft-tissue]
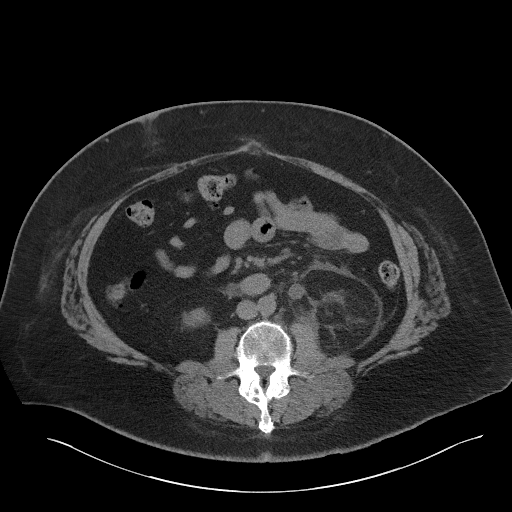
[im 62/95  soft-tissue]
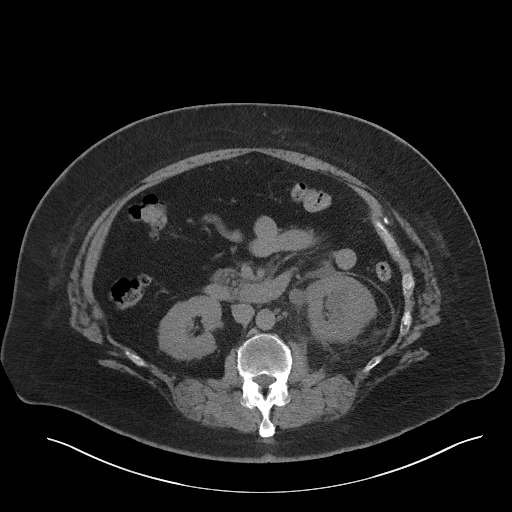
[im 62/95  bone]
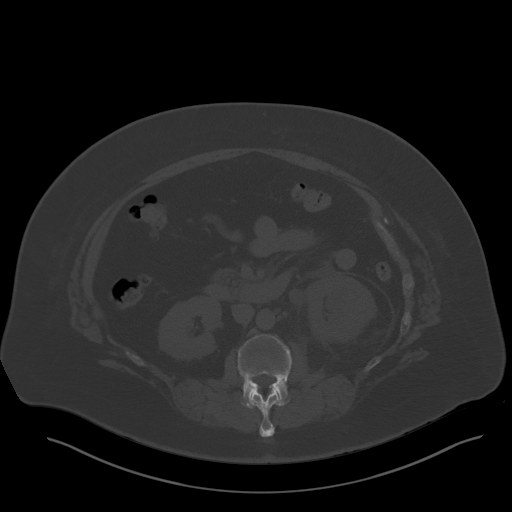
[im 69/95  soft-tissue]
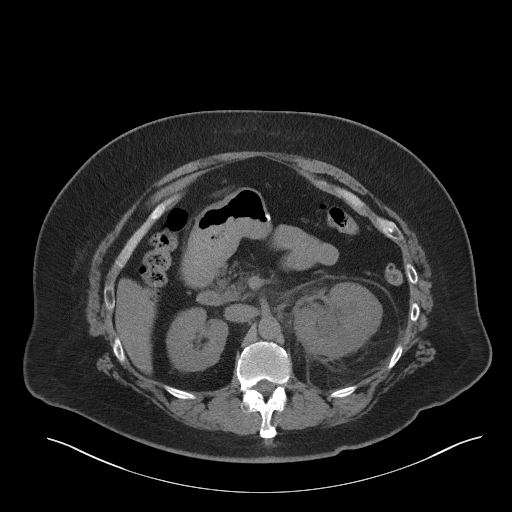
[im 76/95  soft-tissue]
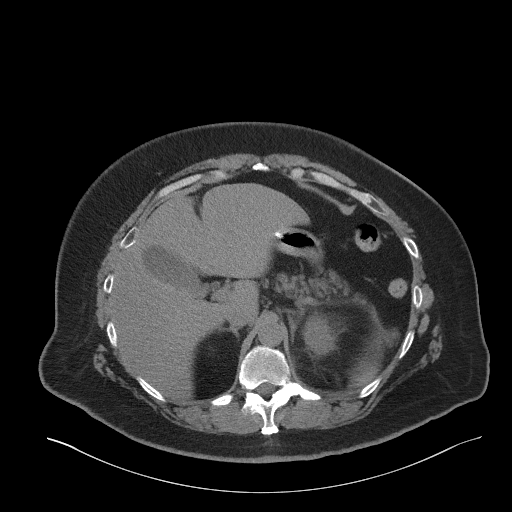
[im 84/95  soft-tissue]
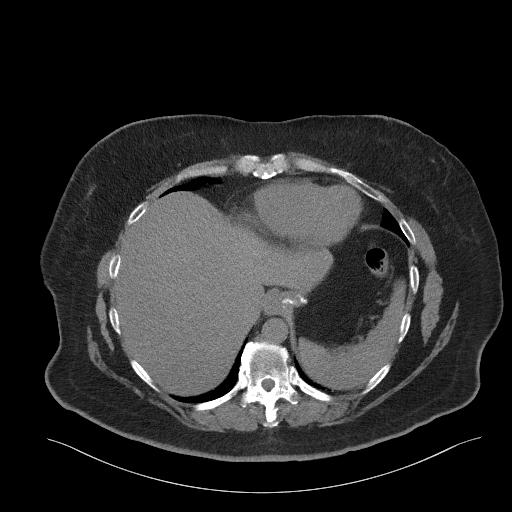
[im 91/95  soft-tissue]
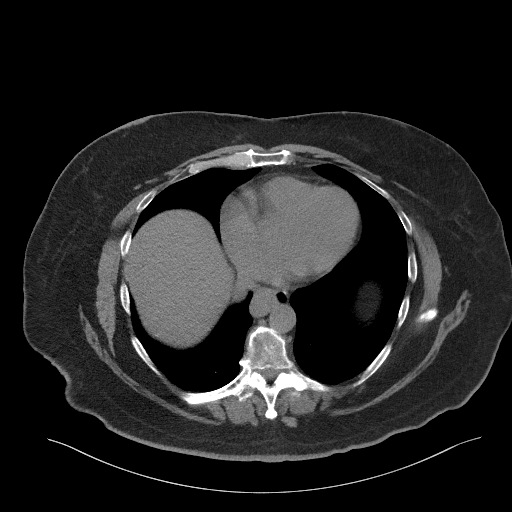

[Series 4: coronal st · coronal · 0.92mm/px · 3 of 105 slices shown]
[im 35/105  soft-tissue]
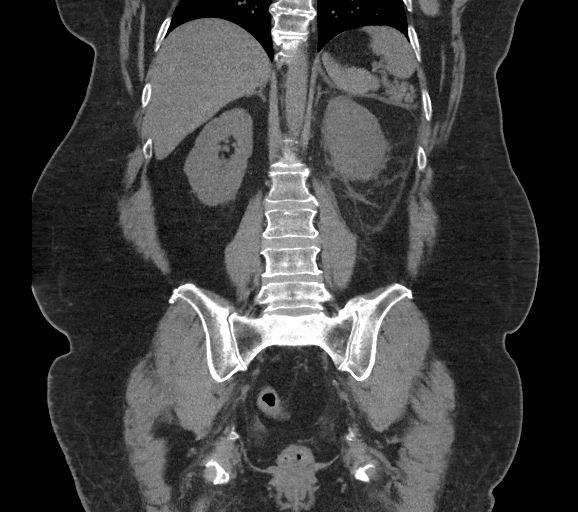
[im 47/105  soft-tissue]
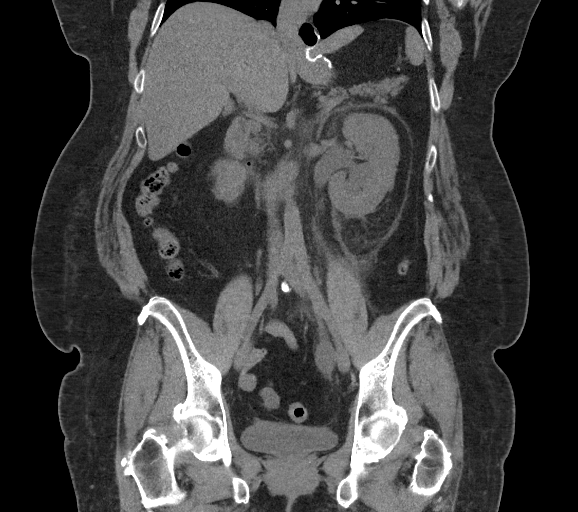
[im 58/105  soft-tissue]
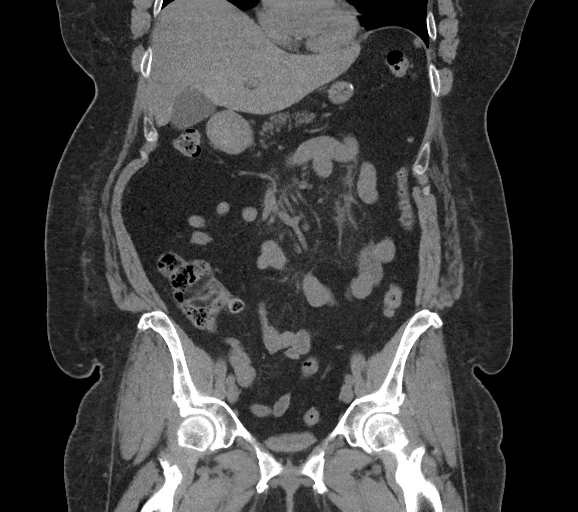

[16 of 46 positions shown; findings below may reference images not displayed]

FINDINGS: Lower chest: No acute abnormality.

Hepatobiliary: No focal liver abnormality is seen. No gallstones,
gallbladder wall thickening, or biliary dilatation.

Pancreas: Moderate pancreatic atrophy. No pancreatic ductal
dilatation or surrounding inflammatory changes.

Spleen: Normal in size without focal abnormality.

Adrenals/Urinary Tract: Adrenal glands are unremarkable. Severe left
hydroureteronephrosis secondary to a 5 x 4 mm calculus in the distal
left ureter near the ureterovesical junction. Dilated distal left
ureter measures up to 1.9 cm. Marked left perinephric fat stranding.
Punctate calculus in the upper pole of the left kidney and another 1
cm calculus/sludge in lower pole of the left kidney. Right kidney
and ureter are normal. Bladder is unremarkable.

Stomach/Bowel: Postsurgical changes for prior sleeve gastrectomy.
Appendix appears normal. No evidence of bowel wall thickening,
distention, or inflammatory changes.

Vascular/Lymphatic: No significant vascular findings are present. No
enlarged abdominal or pelvic lymph nodes.

Reproductive: Status post hysterectomy. No adnexal masses.

Other: No abdominal wall hernia or abnormality. No abdominopelvic
ascites.

Musculoskeletal: Moderate multilevel degenerative disc disease of
the lumbar spine. No acute osseous abnormality.
IMPRESSION: 1. Marked left hydroureteronephrosis secondary to a 5 x 4 mm
calculus in the distal left ureter. The distal ureter is markedly
dilated measuring up to 1.9 cm. Marked left perinephric fat
stranding.

2. Nonobstructing calculi in the upper and lower pole of the left
kidney, the lower pole calculus measuring up to 1 cm.

3.  Right kidney and ureter are unremarkable.

4.  Moderate degenerative disc disease of the lumbar spine.

## 2023-11-21 ENCOUNTER — Other Ambulatory Visit: Payer: Self-pay | Admitting: Internal Medicine

## 2023-11-21 DIAGNOSIS — E0789 Other specified disorders of thyroid: Secondary | ICD-10-CM

## 2023-12-02 ENCOUNTER — Ambulatory Visit: Payer: BLUE CROSS/BLUE SHIELD | Attending: Internal Medicine

## 2023-12-02 DIAGNOSIS — E0789 Other specified disorders of thyroid: Secondary | ICD-10-CM | POA: Insufficient documentation
